# Patient Record
Sex: Female | Born: 1960 | Race: White | Hispanic: No | Smoking: Former smoker
Health system: Southern US, Community
[De-identification: ages and names within clinical notes are randomized; demographics above are authoritative.]

## PROBLEM LIST (undated history)

## (undated) DIAGNOSIS — F5104 Psychophysiologic insomnia: Secondary | ICD-10-CM

## (undated) DIAGNOSIS — E041 Nontoxic single thyroid nodule: Secondary | ICD-10-CM

## (undated) DIAGNOSIS — Z9889 Other specified postprocedural states: Secondary | ICD-10-CM

## (undated) DIAGNOSIS — K219 Gastro-esophageal reflux disease without esophagitis: Secondary | ICD-10-CM

## (undated) DIAGNOSIS — R413 Other amnesia: Principal | ICD-10-CM

## (undated) DIAGNOSIS — J45909 Unspecified asthma, uncomplicated: Secondary | ICD-10-CM

## (undated) DIAGNOSIS — K635 Polyp of colon: Secondary | ICD-10-CM

## (undated) DIAGNOSIS — M778 Other enthesopathies, not elsewhere classified: Secondary | ICD-10-CM

## (undated) DIAGNOSIS — R7309 Other abnormal glucose: Secondary | ICD-10-CM

## (undated) DIAGNOSIS — E039 Hypothyroidism, unspecified: Secondary | ICD-10-CM

## (undated) DIAGNOSIS — K449 Diaphragmatic hernia without obstruction or gangrene: Secondary | ICD-10-CM

## (undated) HISTORY — DX: Unspecified asthma, uncomplicated: J45.909

## (undated) HISTORY — DX: Nontoxic single thyroid nodule: E04.1

## (undated) HISTORY — DX: Polyp of colon: K63.5

## (undated) HISTORY — DX: Diaphragmatic hernia without obstruction or gangrene: K44.9

## (undated) HISTORY — DX: Other abnormal glucose: R73.09

## (undated) HISTORY — PX: BREAST MASS EXCISION: SHX1267

## (undated) HISTORY — PX: THYROIDECTOMY, PARTIAL: SHX18

## (undated) HISTORY — DX: Other specified postprocedural states: Z98.890

## (undated) HISTORY — DX: Gastro-esophageal reflux disease without esophagitis: K21.9

## (undated) HISTORY — DX: Other amnesia: R41.3

## (undated) HISTORY — DX: Other enthesopathies, not elsewhere classified: M77.8

## (undated) HISTORY — DX: Psychophysiologic insomnia: F51.04

## (undated) HISTORY — DX: Hypothyroidism, unspecified: E03.9

---

## 1998-10-01 HISTORY — PX: BREAST CYST EXCISION: SHX579

## 2006-10-04 ENCOUNTER — Other Ambulatory Visit: Admission: RE | Admit: 2006-10-04 | Discharge: 2006-10-04 | Payer: Self-pay | Admitting: Obstetrics & Gynecology

## 2006-10-14 ENCOUNTER — Encounter: Admission: RE | Admit: 2006-10-14 | Discharge: 2006-10-14 | Payer: Self-pay | Admitting: Obstetrics & Gynecology

## 2007-11-24 ENCOUNTER — Other Ambulatory Visit: Admission: RE | Admit: 2007-11-24 | Discharge: 2007-11-24 | Payer: Self-pay | Admitting: Obstetrics & Gynecology

## 2007-12-15 ENCOUNTER — Encounter: Admission: RE | Admit: 2007-12-15 | Discharge: 2007-12-15 | Payer: Self-pay | Admitting: Obstetrics & Gynecology

## 2007-12-26 ENCOUNTER — Encounter: Admission: RE | Admit: 2007-12-26 | Discharge: 2007-12-26 | Payer: Self-pay | Admitting: Obstetrics & Gynecology

## 2008-11-30 ENCOUNTER — Other Ambulatory Visit: Admission: RE | Admit: 2008-11-30 | Discharge: 2008-11-30 | Payer: Self-pay | Admitting: Obstetrics & Gynecology

## 2008-12-28 ENCOUNTER — Encounter: Admission: RE | Admit: 2008-12-28 | Discharge: 2008-12-28 | Payer: Self-pay | Admitting: Obstetrics & Gynecology

## 2010-01-20 ENCOUNTER — Encounter: Admission: RE | Admit: 2010-01-20 | Discharge: 2010-01-20 | Payer: Self-pay | Admitting: Family Medicine

## 2010-10-22 ENCOUNTER — Encounter: Payer: Self-pay | Admitting: Obstetrics & Gynecology

## 2011-01-05 ENCOUNTER — Other Ambulatory Visit: Payer: Self-pay | Admitting: Obstetrics & Gynecology

## 2011-01-05 DIAGNOSIS — Z1231 Encounter for screening mammogram for malignant neoplasm of breast: Secondary | ICD-10-CM

## 2011-01-24 ENCOUNTER — Ambulatory Visit: Payer: Self-pay

## 2011-01-26 ENCOUNTER — Ambulatory Visit: Payer: Self-pay

## 2011-02-08 ENCOUNTER — Ambulatory Visit
Admission: RE | Admit: 2011-02-08 | Discharge: 2011-02-08 | Disposition: A | Discharge status: Home / Self Care | Payer: 59 | Source: Ambulatory Visit | Attending: Obstetrics & Gynecology | Admitting: Obstetrics & Gynecology

## 2011-02-08 DIAGNOSIS — Z1231 Encounter for screening mammogram for malignant neoplasm of breast: Secondary | ICD-10-CM

## 2011-03-02 HISTORY — PX: HEMORRHOID BANDING: SHX5850

## 2011-12-24 ENCOUNTER — Other Ambulatory Visit: Payer: Self-pay | Admitting: Obstetrics & Gynecology

## 2011-12-24 DIAGNOSIS — Z1231 Encounter for screening mammogram for malignant neoplasm of breast: Secondary | ICD-10-CM

## 2011-12-26 ENCOUNTER — Other Ambulatory Visit: Payer: Self-pay | Admitting: Obstetrics & Gynecology

## 2011-12-26 DIAGNOSIS — Z780 Asymptomatic menopausal state: Secondary | ICD-10-CM

## 2011-12-26 DIAGNOSIS — M899 Disorder of bone, unspecified: Secondary | ICD-10-CM

## 2012-01-11 ENCOUNTER — Other Ambulatory Visit: Payer: 59

## 2012-02-22 ENCOUNTER — Ambulatory Visit: Payer: 59

## 2012-02-22 ENCOUNTER — Inpatient Hospital Stay: Admission: RE | Admit: 2012-02-22 | Payer: 59 | Source: Ambulatory Visit

## 2012-03-03 ENCOUNTER — Ambulatory Visit: Payer: 59

## 2012-03-03 ENCOUNTER — Other Ambulatory Visit: Payer: 59

## 2012-04-01 ENCOUNTER — Ambulatory Visit
Admission: RE | Admit: 2012-04-01 | Discharge: 2012-04-01 | Disposition: A | Discharge status: Home / Self Care | Payer: 59 | Source: Ambulatory Visit | Attending: Obstetrics & Gynecology | Admitting: Obstetrics & Gynecology

## 2012-04-01 DIAGNOSIS — Z780 Asymptomatic menopausal state: Secondary | ICD-10-CM

## 2012-04-01 DIAGNOSIS — M949 Disorder of cartilage, unspecified: Secondary | ICD-10-CM

## 2012-04-01 DIAGNOSIS — Z1231 Encounter for screening mammogram for malignant neoplasm of breast: Secondary | ICD-10-CM

## 2013-01-01 ENCOUNTER — Ambulatory Visit: Payer: Self-pay | Admitting: Obstetrics & Gynecology

## 2013-02-24 ENCOUNTER — Ambulatory Visit (INDEPENDENT_AMBULATORY_CARE_PROVIDER_SITE_OTHER): Payer: 59 | Admitting: Gynecology

## 2013-02-24 ENCOUNTER — Encounter: Payer: Self-pay | Admitting: Gynecology

## 2013-02-24 VITALS — BP 94/62 | HR 84 | Ht 66.0 in | Wt 167.0 lb

## 2013-02-24 DIAGNOSIS — E039 Hypothyroidism, unspecified: Secondary | ICD-10-CM | POA: Insufficient documentation

## 2013-02-24 DIAGNOSIS — Z01419 Encounter for gynecological examination (general) (routine) without abnormal findings: Secondary | ICD-10-CM

## 2013-02-24 DIAGNOSIS — IMO0002 Reserved for concepts with insufficient information to code with codable children: Secondary | ICD-10-CM | POA: Insufficient documentation

## 2013-02-24 DIAGNOSIS — R87619 Unspecified abnormal cytological findings in specimens from cervix uteri: Secondary | ICD-10-CM

## 2013-02-24 DIAGNOSIS — F17200 Nicotine dependence, unspecified, uncomplicated: Secondary | ICD-10-CM | POA: Insufficient documentation

## 2013-02-24 NOTE — Patient Instructions (Signed)

## 2013-02-24 NOTE — Progress Notes (Signed)
Patient ID: Latasha Bell, female   DOB: 08-28-1961, 52 y.o.   MRN: [id number] 52 y.o.     Caucasian   female   G2P1   here for annual exam.  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  Last menses several years ago, no PMB.  Pt did not follow up for 10/13 PAP.  Not currently sexually active.  Restarted smoking after stopping for 10y.  No LMP recorded. Patient is postmenopausal.          Sexually active: no  The current method of family planning is none.    Exercising: walking daily Last mammogram:  04/01/12, normal Last pap smear: 12/10/11 History of abnormal pap: yes  Smoking: trying to quit Alcohol: 3-4 drinks/week Last colonoscopy: 2010, rpt in 5 years Last Bone Density:  04/01/12 Last tetanus shot:2005 Last cholesterol check: March 2014   Hgb:  PCP      Urine:  PCP   Family History  Problem Relation Age of Onset  . Breast cancer Mother 30  . Colon cancer Father 37  . Cancer Brother     throat    There are no active problems to display for this patient.   Past Medical History  Diagnosis Date  . Asthma   . Hypothyroid   . Hiatal hernia   . Thyroid nodule     solid    Past Surgical History  Procedure Laterality Date  . Breast mass excision      2 breast bx's--fibrocystic changes  . Thyroidectomy, partial    . Hemorrhoid banding  03/2011    Dr. Kinnie Scales    Allergies: Review of patient's allergies indicates no known allergies.  Current Outpatient Prescriptions  Medication Sig Dispense Refill  . albuterol (PROVENTIL HFA;VENTOLIN HFA) 108 (90 BASE) MCG/ACT inhaler Inhale 1 puff into the lungs every 6 (six) hours as needed for wheezing.      Marland Kitchen Dexlansoprazole (DEXILANT) 30 MG capsule Take 30 mg by mouth daily.      . Fluticasone-Salmeterol (ADVAIR) 250-50 MCG/DOSE AEPB Inhale 1 puff into the lungs every 12 (twelve) hours.      Marland Kitchen levothyroxine (SYNTHROID, LEVOTHROID) 25 MCG tablet Take 25 mcg by mouth daily.      . Multiple Vitamin (MULTIVITAMIN) capsule Take 1  capsule by mouth daily.       No current facility-administered medications for this visit.    ROS: Pertinent items are noted in HPI.  Social Hx:    Exam:    BP 94/62  Pulse 84  Ht 5\' 6"  (1.676 m)  Wt 167 lb (75.751 kg)  BMI 26.97 kg/m2   Wt Readings from Last 3 Encounters:  02/24/13 167 lb (75.751 kg)     Ht Readings from Last 3 Encounters:  02/24/13 5\' 6"  (1.676 m)    General appearance: alert, cooperative and appears stated age Head: Normocephalic, without obvious abnormality, atraumatic Neck: no adenopathy, supple, symmetrical, trachea midline and thyroid not enlarged, symmetric, no tenderness/mass/nodules Lungs: clear to auscultation bilaterally Breasts: Inspection negative, No nipple retraction or dimpling, No nipple discharge or bleeding, No axillary or supraclavicular adenopathy, Normal to palpation without dominant masses fibrocystic Heart: regular rate and rhythm Abdomen: soft, non-tender; bowel sounds normal; no masses,  no organomegaly Extremities: extremities normal, atraumatic, no cyanosis or edema Skin: Skin color, texture, turgor normal. No rashes or lesions Lymph nodes: Cervical, supraclavicular, and axillary nodes normal. No abnormal inguinal nodes palpated Neurologic: Grossly normal   Pelvic: External genitalia:  no lesions  Urethra:  normal appearing urethra with no masses, tenderness or lesions              Bartholins and Skenes: normal                 Vagina: normal appearing vagina with normal color and discharge, no lesions              Cervix: normal appearance              Pap taken: yes        Bimanual Exam:  Uterus:  uterus is normal size, shape, consistency and nontender                                      Adnexa: normal adnexa in size, nontender and no masses                                      Rectovaginal: Confirms                                      Anus:  normal sphincter tone, no lesions  A: normal menopausal exam H/O  AGUS     P: mammogram qy, BSE pap smear not done 10/13, repeat today Labs at PCP Pt lives in [place] now, recommend getting MD there return annually or prn     An After Visit Summary was printed and given to the patient.

## 2013-02-26 LAB — IPS PAP TEST WITH HPV

## 2013-02-27 ENCOUNTER — Telehealth: Payer: Self-pay | Admitting: *Deleted

## 2013-02-27 ENCOUNTER — Other Ambulatory Visit: Payer: Self-pay | Admitting: Gynecology

## 2013-02-27 DIAGNOSIS — R87610 Atypical squamous cells of undetermined significance on cytologic smear of cervix (ASC-US): Secondary | ICD-10-CM

## 2013-02-27 DIAGNOSIS — R87619 Unspecified abnormal cytological findings in specimens from cervix uteri: Secondary | ICD-10-CM

## 2013-02-27 NOTE — Telephone Encounter (Signed)
Ideally yes, can we offer her a Monday evening or Friday morning so we don't interfer with her work?

## 2013-02-27 NOTE — Telephone Encounter (Signed)
Patient notified of pap result and need for colpo. Brief reminder of this procedure.  She requests to schedule week of 03-30-13 but Dr Farrel Gobble will be out of office.  Colpo scheduled for 03-27-13.  Do I need to "encourage" her to move this up?

## 2013-02-27 NOTE — Telephone Encounter (Signed)
Message copied by Alisa Graff on Fri Feb 27, 2013  9:36 AM ------      Message from: Douglass Rivers      Created: Fri Feb 27, 2013  7:00 AM       Pt is ASCUS-H, AGUS with HRHPV, last yr AGUS +HR, LGSIL on colpo, will need repeat colpo ------

## 2013-03-02 ENCOUNTER — Other Ambulatory Visit: Payer: Self-pay | Admitting: Gynecology

## 2013-03-02 DIAGNOSIS — IMO0002 Reserved for concepts with insufficient information to code with codable children: Secondary | ICD-10-CM

## 2013-03-05 ENCOUNTER — Other Ambulatory Visit: Payer: Self-pay

## 2013-03-05 DIAGNOSIS — Z1231 Encounter for screening mammogram for malignant neoplasm of breast: Secondary | ICD-10-CM

## 2013-03-23 ENCOUNTER — Ambulatory Visit (INDEPENDENT_AMBULATORY_CARE_PROVIDER_SITE_OTHER): Payer: 59 | Admitting: Gynecology

## 2013-03-23 ENCOUNTER — Encounter: Payer: Self-pay | Admitting: Gynecology

## 2013-03-23 VITALS — BP 100/60 | HR 76 | Ht 66.0 in | Wt 170.0 lb

## 2013-03-23 DIAGNOSIS — R87619 Unspecified abnormal cytological findings in specimens from cervix uteri: Secondary | ICD-10-CM

## 2013-03-23 DIAGNOSIS — R87810 Cervical high risk human papillomavirus (HPV) DNA test positive: Secondary | ICD-10-CM

## 2013-03-23 NOTE — Procedures (Signed)
52 y.o. single Caucasian  female  OB History   Grav Para Term Preterm Abortions TAB SAB Ect Mult Living   2 1        1      here for colposcopy. Pap done 02/26/2013 showed glandular cell abnormality (AGUS) +hrhpv  No LMP recorded. Patient is postmenopausal.  Contraception:  Post-menopausal   There were no vitals taken for this visit.  Procedure explained and patient's questions were invited and answered.  Consent form signed.    Role of HPV in genesis of SIL discussed with patient, and questions answered.   Speculum inserted atraumatically and cervix visualized.  3% acetic acid applied.  Cervix examined using 3.75 and 7.5   X magnification and green filter.    Gross appearance:normal  Squamocolumnar junction seen in entirety: yes   acetowhite lesion(s) noted circumference and abnormal vessels noted at 1, 4 o'clock  cervix swabbed with Lugol's solution, endocervical speculum placed, endocervical lesion noted at 4 o'clock, endocervical curettage performed, endometrial biopsy performed, cervical biopsies taken at 1,4,7 o'clock, specimen labelled and sent to pathology and hemostasis achieved with Monsel's solution  Extent of lesion entirely seen: yes  Patient tolerated procedure well.   Plan:  Will base further treatment on Pathology findings.  Post biopsy instructions and AVS given to patient.

## 2013-03-23 NOTE — Progress Notes (Signed)
Subjective:     Patient ID: Latasha Bell, female   DOB: 05-22-1961, 52 y.o.   MRN: [id number]  HPI  Here for colposcopy and EMB for AGUS, +HRHPV H/o LGSIL   Review of Systems     Objective:   Physical Exam  Genitourinary:    colpo images, see procedure note  EMB: 2 passes of pipelle sounded 6.5cm scant tissue obtained, to path Tolerated well     Assessment:     AGUS     Plan:     EMB, Colpo

## 2013-03-26 LAB — IPS CERVICAL/ECC/EMB/VULVAR/VAGINAL BIOPSY

## 2013-03-27 ENCOUNTER — Ambulatory Visit: Payer: Self-pay | Admitting: Gynecology

## 2013-03-30 ENCOUNTER — Telehealth: Payer: Self-pay | Admitting: *Deleted

## 2013-03-30 DIAGNOSIS — IMO0002 Reserved for concepts with insufficient information to code with codable children: Secondary | ICD-10-CM

## 2013-03-30 NOTE — Telephone Encounter (Signed)
Message copied by Alisa Graff on Mon Mar 30, 2013  9:31 AM ------      Message from: Douglass Rivers      Created: Fri Mar 27, 2013  7:59 AM       Pt has HGSIL on 2 biopsies, AGUS +HR , neg EMB, will need LEEP, please call pt after weekend ------

## 2013-03-30 NOTE — Telephone Encounter (Signed)
Message copied by Alisa Graff on Mon Mar 30, 2013  9:40 AM ------      Message from: Douglass Rivers      Created: Fri Mar 27, 2013  7:59 AM       Pt has HGSIL on 2 biopsies, AGUS +HR , neg EMB, will need LEEP, please call pt after weekend ------

## 2013-03-30 NOTE — Telephone Encounter (Signed)
Patient notified of results and discussed recommendation for LEEP.  Patient PMP, she is given several appt options on Mon and Fri (per her request) and she will call me back after checking her schedule.  She is instructed to take Motrin 800 mg 1 hr prior with food.

## 2013-03-31 DIAGNOSIS — Z9889 Other specified postprocedural states: Secondary | ICD-10-CM

## 2013-03-31 HISTORY — DX: Other specified postprocedural states: Z98.890

## 2013-04-06 ENCOUNTER — Encounter: Payer: Self-pay | Admitting: Gynecology

## 2013-04-09 ENCOUNTER — Ambulatory Visit (INDEPENDENT_AMBULATORY_CARE_PROVIDER_SITE_OTHER): Payer: 59 | Admitting: Gynecology

## 2013-04-09 VITALS — BP 100/60 | HR 80 | Resp 12 | Ht 66.0 in | Wt 169.0 lb

## 2013-04-09 DIAGNOSIS — IMO0002 Reserved for concepts with insufficient information to code with codable children: Secondary | ICD-10-CM

## 2013-04-09 DIAGNOSIS — R87613 High grade squamous intraepithelial lesion on cytologic smear of cervix (HGSIL): Secondary | ICD-10-CM

## 2013-04-09 NOTE — Procedures (Signed)
52 y.o. single Caucasian female OB History   Grav Para Term Preterm Abortions TAB SAB Ect Mult Living   2 1        1      here for LEEP.    Pap History:glandular cell abnormality (AGUS) on Pap done June 2014   Patient's last menstrual period was 10/01/2008. postmenopausal  Pre-procedure vitals: Blood pressure 100/60, pulse 80, resp. rate 12, height 5\' 6"  (1.676 m), weight 169 lb (76.658 kg), last menstrual period 10/01/2008.   Procedure explained and patient's questions were invited and answered.   Consent form signed.  Pre-procedure medication:  800mg  Motrin.  Time given:  2p  Procedure Set-up: Grounding pad located left thigh.  Cautery settings:60 cut/60coagulation.  Suction applied to coated speculum.  Procedure:  Speculum placed with good visualization of the cervix.  Colposcopy performed showing:  no abnormal vasculature and acetowhite lesion(s) noted at 12,4 and 7 o'clock.  Cervix anesthetized using 2% Xylocaine with 1:100,000units Epinephrine.  10 cc's used0.25%marcaine-5cc.Entire transition zone with 20mm loop in 2passes.  Specimen(s) placed on cork and labeled for pathology.  Hemostasis obtained with ball cautery and Monsel's solution.  EBL:  Minimal  Complications:none  Patient tolerated procedure well and left the office in satisfactory condition.  Plan:  After visit summary given.  Repeat pap planned in 12 months.

## 2013-04-14 LAB — IPS CERVICAL/ECC/EMB/VULVAR/VAGINAL BIOPSY

## 2013-04-29 ENCOUNTER — Telehealth: Payer: Self-pay | Admitting: Gynecology

## 2013-04-29 NOTE — Telephone Encounter (Signed)
Patient had LEEP procedure done 04/09/2013 with Dr. Farrel Gobble Calling of concern of spotting or noticing a trace of bleeding when wipe with urination.  Patient is out of town and will not be back till August 22,2014. Patient states she did have the spotting after the procedure as she was told to expect. Noticed the spotting Saturday when wiped and then it went away. Noted again on yesterday and went away. Today has noticed again just a trace of bleeding with wiping.  Per Dr. Farrel Gobble patient will continue to monitor and is to call back if any heavy bleeding noted and Dr. Farrel Gobble will put her in contact with someone to see and be checked in [place]. Patient is currently in [place]. Patient is to follow up with Dr. Farrel Gobble on 05/22/2013 @2 :30pm if bleeding is continuing. Appt. Made.

## 2013-04-29 NOTE — Telephone Encounter (Signed)
Patient had procedure done on 04-09-2013. Was told she would have .Some light spotting at first.  20 days later she is still experiencing some spotting wants to know if this is normal?  Should she be concerned?

## 2013-05-22 ENCOUNTER — Ambulatory Visit: Payer: 59 | Admitting: Gynecology

## 2013-05-28 ENCOUNTER — Ambulatory Visit
Admission: RE | Admit: 2013-05-28 | Discharge: 2013-05-28 | Disposition: A | Discharge status: Home / Self Care | Payer: 59 | Source: Ambulatory Visit

## 2013-05-28 DIAGNOSIS — Z1231 Encounter for screening mammogram for malignant neoplasm of breast: Secondary | ICD-10-CM

## 2013-08-06 ENCOUNTER — Other Ambulatory Visit: Payer: Self-pay

## 2013-08-17 ENCOUNTER — Encounter (HOSPITAL_COMMUNITY): Payer: Self-pay | Admitting: Emergency Medicine

## 2013-08-17 ENCOUNTER — Emergency Department (HOSPITAL_COMMUNITY): Payer: 59

## 2013-08-17 ENCOUNTER — Emergency Department (HOSPITAL_COMMUNITY)
Admission: EM | Admit: 2013-08-17 | Discharge: 2013-08-17 | Disposition: A | Discharge status: Home / Self Care | Payer: 59 | Attending: Emergency Medicine | Admitting: Emergency Medicine

## 2013-08-17 DIAGNOSIS — K219 Gastro-esophageal reflux disease without esophagitis: Secondary | ICD-10-CM | POA: Insufficient documentation

## 2013-08-17 DIAGNOSIS — M546 Pain in thoracic spine: Secondary | ICD-10-CM | POA: Insufficient documentation

## 2013-08-17 DIAGNOSIS — Z79899 Other long term (current) drug therapy: Secondary | ICD-10-CM | POA: Insufficient documentation

## 2013-08-17 DIAGNOSIS — J45909 Unspecified asthma, uncomplicated: Secondary | ICD-10-CM | POA: Insufficient documentation

## 2013-08-17 DIAGNOSIS — E039 Hypothyroidism, unspecified: Secondary | ICD-10-CM | POA: Insufficient documentation

## 2013-08-17 DIAGNOSIS — IMO0002 Reserved for concepts with insufficient information to code with codable children: Secondary | ICD-10-CM | POA: Insufficient documentation

## 2013-08-17 DIAGNOSIS — M549 Dorsalgia, unspecified: Secondary | ICD-10-CM

## 2013-08-17 DIAGNOSIS — F17200 Nicotine dependence, unspecified, uncomplicated: Secondary | ICD-10-CM | POA: Insufficient documentation

## 2013-08-17 LAB — COMPREHENSIVE METABOLIC PANEL
AST: 19 U/L (ref 0–37)
BUN: 8 mg/dL (ref 6–23)
CO2: 28 mEq/L (ref 19–32)
Calcium: 8.9 mg/dL (ref 8.4–10.5)
Chloride: 104 mEq/L (ref 96–112)
Creatinine, Ser: 0.88 mg/dL (ref 0.50–1.10)
GFR calc non Af Amer: 74 mL/min — ABNORMAL LOW (ref >90)
Glucose, Bld: 107 mg/dL — ABNORMAL HIGH (ref 70–99)
Total Bilirubin: 0.2 mg/dL — ABNORMAL LOW (ref 0.3–1.2)

## 2013-08-17 LAB — CBC
HCT: 39.1 % (ref 36.0–46.0)
Hemoglobin: 13.2 g/dL (ref 12.0–15.0)
MCV: 91.1 fL (ref 78.0–100.0)
RBC: 4.29 MIL/uL (ref 3.87–5.11)
WBC: 5.5 10*3/uL (ref 4.0–10.5)

## 2013-08-17 LAB — POCT I-STAT TROPONIN I

## 2013-08-17 MED ORDER — HYDROCODONE-ACETAMINOPHEN 5-325 MG PO TABS: 2.0000 | ORAL_TABLET | ORAL | Status: DC | PRN

## 2013-08-17 MED ORDER — KETOROLAC TROMETHAMINE 30 MG/ML IJ SOLN: 30.0000 mg | Freq: Once | INTRAMUSCULAR | Status: DC

## 2013-08-17 MED ORDER — MORPHINE SULFATE 2 MG/ML IJ SOLN
2.0000 mg | Freq: Once | INTRAMUSCULAR | Status: AC
  Administered 2013-08-17: 2 mg via INTRAVENOUS
  Filled 2013-08-17: qty 1

## 2013-08-17 MED ORDER — HYDROCODONE-ACETAMINOPHEN 5-325 MG PO TABS
1.0000 | ORAL_TABLET | Freq: Once | ORAL | Status: AC
  Administered 2013-08-17: 1 via ORAL
  Filled 2013-08-17: qty 1

## 2013-08-17 NOTE — ED Notes (Signed)
Patient states back pain starting this am at approx 0400.  Patient states throbbing, pressure type pain.  Patient states no chest pain/sob. No urinary symptoms.

## 2013-08-17 NOTE — ED Notes (Signed)
Patient transported to X-ray 

## 2013-08-17 NOTE — ED Provider Notes (Signed)
CSN: [id number]     Arrival date & time 08/17/13  6578 History   First MD Initiated Contact with Patient 08/17/13 [phone]     Chief Complaint  Patient presents with  . Back Pain   (Consider location/radiation/quality/duration/timing/severity/associated sxs/prior Treatment) HPI Comments: The patient is a 52 year-old female with a past medical history of GERD, , presenting the Emergency Department with a chief complaint of upper back discomfort.  0345 awoke from sleeping Pressure Constant  No history of trauma  No recent overuse Nothing aggravates Nothing alleviates  Discomfort was not relived by nitro.  No family history of CAD   Patient is a 52 y.o. female presenting with back pain. The history is provided by the patient. No language interpreter was used.  Back Pain Location:  Thoracic spine Quality:  Aching Radiates to:  Does not radiate Pain severity:  Moderate Pain is:  Unable to specify Onset quality:  Sudden (0345 today) Duration:  2 hours Timing:  Constant Progression:  Unchanged Chronicity:  New Context: not emotional stress, not falling, not jumping from heights, not lifting heavy objects, not MCA, not MVA, not occupational injury, not pedestrian accident, not physical stress, not recent illness, not recent injury and not twisting   Relieved by:  Nothing Worsened by:  Nothing tried Ineffective treatments: Nitro. Associated symptoms: no abdominal pain, no abdominal swelling, no bladder incontinence, no bowel incontinence, no chest pain, no dysuria, no fever, no headaches, no leg pain, no numbness, no paresthesias, no perianal numbness, no tingling and no weakness   Risk factors: no lack of exercise, not obese, not pregnant and no recent surgery     Past Medical History  Diagnosis Date  . Asthma   . Hypothyroid   . Hiatal hernia   . Thyroid nodule     solid   Past Surgical History  Procedure Laterality Date  . Breast mass excision      2 breast  bx's--fibrocystic changes  . Thyroidectomy, partial    . Hemorrhoid banding  03/2011    Dr. Kinnie Scales   Family History  Problem Relation Age of Onset  . Breast cancer Mother 28  . Colon cancer Father 67  . Cancer Brother     throat   History  Substance Use Topics  . Smoking status: Current Every Day Smoker -- 0.50 packs/day for 15 years    Types: Cigarettes  . Smokeless tobacco: Never Used  . Alcohol Use: 1.5 - 2 oz/week    3-4 drink(s) per week   OB History   Grav Para Term Preterm Abortions TAB SAB Ect Mult Living   2 1        1      Review of Systems  Constitutional: Negative for fever.  Cardiovascular: Negative for chest pain.  Gastrointestinal: Negative for abdominal pain and bowel incontinence.  Genitourinary: Negative for bladder incontinence and dysuria.  Musculoskeletal: Positive for back pain.  Neurological: Negative for tingling, weakness, numbness, headaches and paresthesias.  All other systems reviewed and are negative.    Allergies  Review of patient's allergies indicates no known allergies.  Home Medications   Current Outpatient Rx  Name  Route  Sig  Dispense  Refill  . albuterol (PROVENTIL HFA;VENTOLIN HFA) 108 (90 BASE) MCG/ACT inhaler   Inhalation   Inhale 1 puff into the lungs every 6 (six) hours as needed for wheezing.         Marland Kitchen AMITRIPTYLINE HCL PO   Oral   Take 1 capsule by mouth  at bedtime.         . BuPROPion HCl (WELLBUTRIN PO)   Oral   Take 2 tablets by mouth every morning.         Marland Kitchen Dexlansoprazole (DEXILANT) 30 MG capsule   Oral   Take 30 mg by mouth daily.         Marland Kitchen levothyroxine (SYNTHROID, LEVOTHROID) 25 MCG tablet   Oral   Take 25 mcg by mouth daily.         . mometasone-formoterol (DULERA) 100-5 MCG/ACT AERO   Inhalation   Inhale 2 puffs into the lungs 2 (two) times daily.         . Multiple Vitamin (MULTIVITAMIN) capsule   Oral   Take 1 capsule by mouth daily.         Marland Kitchen HYDROcodone-acetaminophen  (NORCO/VICODIN) 5-325 MG per tablet   Oral   Take 2 tablets by mouth every 4 (four) hours as needed.   10 tablet   0    BP 120/72  Temp(Src) 98.1 F (36.7 C) (Oral)  Resp 18  SpO2 100%  LMP 10/01/2008 Physical Exam  Nursing note and vitals reviewed. Constitutional: She is oriented to person, place, and time. Vital signs are normal. She appears well-developed and well-nourished. She is active. No distress.  HENT:  Head: Normocephalic and atraumatic.  Mouth/Throat: No oropharyngeal exudate.  Neck: Neck supple.  Cardiovascular: Normal rate and regular rhythm.   Pulmonary/Chest: Effort normal and breath sounds normal. No respiratory distress. She has no wheezes. She has no rales. She exhibits no tenderness.  Abdominal: Soft. Bowel sounds are normal. She exhibits no distension. There is no tenderness. There is no rebound.  Musculoskeletal: She exhibits no edema.       Thoracic back: Normal. She exhibits normal range of motion, no bony tenderness, no swelling, no deformity and no spasm.       Back:  Full active ROM to T-spine, no midline tenderness.  Non-tender to palpation.  Neurological: She is alert and oriented to person, place, and time.  Skin: Skin is warm and dry.  Psychiatric: She has a normal mood and affect.    ED Course  Procedures (including critical care time) Labs Review Labs Reviewed  COMPREHENSIVE METABOLIC PANEL - Abnormal; Notable for the following:    Potassium 3.4 (*)    Glucose, Bld 107 (*)    Total Bilirubin 0.2 (*)    GFR calc non Af Amer 74 (*)    GFR calc Af Amer 85 (*)    All other components within normal limits  CBC  POCT I-STAT TROPONIN I  POCT I-STAT TROPONIN I   Imaging Review No results found.  EKG Interpretation    Date/Time:  Monday August 17 2013 06:57:11 EST Ventricular Rate:  67 PR Interval:  155 QRS Duration: 82 QT Interval:  391 QTC Calculation: 413 R Axis:   -10 Text Interpretation:  Sinus or ectopic atrial rhythm Low  voltage, precordial leads Probable anteroseptal infarct, old ED PHYSICIAN INTERPRETATION AVAILABLE IN [facility] Confirmed by TEST, RECORD ([id number]) on 08/19/2013 8:17:21 AM            MDM   1. Upper back pain   The patient reports  A sudden onset of upper back pain without radiation. Not reproducible by movement or palpation.  Rule out ACS and fracture or lung etiology.  Labs and imaging ordered. Pain medication ordered.  Discussed patient condition with Dr. Oletta Lamas who agrees with current lab and imaging studies.  Troponin-0.00 XR-without acute cause of pain. 0745-Discussed lab results, imaging results with the patient she reports her discomfort is the same, medication was just given to the patient.  No wheezing on exam  Troponin-0.00 Discussed labs and imagine  Discussed lab results, imaging results, and treatment plan with the patient.  She reports understanding and no other concerns at this time.   Patient is stable for discharge at this time.  Meds given in ED:  Medications  morphine 2 MG/ML injection 2 mg (2 mg Intravenous Given 08/17/13 0747)  HYDROcodone-acetaminophen (NORCO/VICODIN) 5-325 MG per tablet 1 tablet (1 tablet Oral Given 08/17/13 1054)    Discharge Medication List as of 08/17/2013 10:48 AM    START taking these medications   Details  HYDROcodone-acetaminophen (NORCO/VICODIN) 5-325 MG per tablet Take 2 tablets by mouth every 4 (four) hours as needed., Starting 08/17/2013, Until Discontinued, Print          Clabe Seal, PA-C 08/20/13 1339

## 2013-08-17 NOTE — ED Notes (Signed)
Patient received ASA 324 mg and Nitro x 1 per EMS pta.  No relief with nitro.

## 2013-08-21 NOTE — ED Provider Notes (Signed)
Medical screening examination/treatment/procedure(s) were performed by non-physician practitioner and as supervising physician I was immediately available for consultation/collaboration.  EKG Interpretation    Date/Time:  Monday August 17 2013 06:57:11 EST Ventricular Rate:  67 PR Interval:  155 QRS Duration: 82 QT Interval:  391 QTC Calculation: 413 R Axis:   -10 Text Interpretation:  Sinus or ectopic atrial rhythm Low voltage, precordial leads Probable anteroseptal infarct, old ED PHYSICIAN INTERPRETATION AVAILABLE IN [facility] Confirmed by TEST, RECORD ([id number]) on 08/19/2013 8:17:21 AM             Olivia Mackie, MD 08/21/13 1939

## 2014-03-29 ENCOUNTER — Ambulatory Visit (INDEPENDENT_AMBULATORY_CARE_PROVIDER_SITE_OTHER): Payer: 59 | Admitting: Gynecology

## 2014-03-29 ENCOUNTER — Encounter: Payer: Self-pay | Admitting: Gynecology

## 2014-03-29 VITALS — BP 110/70 | HR 60 | Resp 18 | Ht 66.5 in | Wt 176.0 lb

## 2014-03-29 DIAGNOSIS — Z9889 Other specified postprocedural states: Secondary | ICD-10-CM

## 2014-03-29 DIAGNOSIS — R6889 Other general symptoms and signs: Secondary | ICD-10-CM

## 2014-03-29 DIAGNOSIS — Z124 Encounter for screening for malignant neoplasm of cervix: Secondary | ICD-10-CM

## 2014-03-29 DIAGNOSIS — G4700 Insomnia, unspecified: Secondary | ICD-10-CM

## 2014-03-29 DIAGNOSIS — IMO0002 Reserved for concepts with insufficient information to code with codable children: Secondary | ICD-10-CM

## 2014-03-29 DIAGNOSIS — Z01419 Encounter for gynecological examination (general) (routine) without abnormal findings: Secondary | ICD-10-CM

## 2014-03-29 NOTE — Progress Notes (Signed)
53 y.o. Single Caucasian female   G2P1 here for annual exam. Pt reports menses are absent due to Menopause regular.  She occassionally report hot flashes, occassionally have night sweats, does have vaginal dryness.  She is not using lubricants.  She does not report post-menopasual bleeding. Pt quit smoking 5031m ago, has not needed inhaler as much, did gain 10# Pt reports issues with insomnia, can fall asleep and wake up a few hours later, or cannot fall asleep.  Pt can drink a few glasses of ice tea/d-16-24oz.  Patient's last menstrual period was 10/01/2008.          Sexually active: Yes.    The current method of family planning is post menopausal status.    Exercising: Yes.    walking 3-4x/wk for a hour Last pap: 6/14- AGUS +HPV, LEEP  Abnormal PAP: yes Mammogram: 05/28/13 Bi-Rads 1 BSE: kind of  Colonoscopy: 03/26/14 Normal f/u in 5 years  DEXA: 04/01/12  Alcohol: 3-4 drinks/wk Tobacco: no  Labs: PCP   Health Maintenance  Topic Date Due  . Tetanus/tdap  03/31/1979  . Colonoscopy  03/30/2010  . Influenza Vaccine  05/01/2014  . Mammogram  05/29/2015  . Pap Smear  02/25/2016    Family History  Problem Relation Age of Onset  . Breast cancer Mother 3450  . Colon cancer Father 4676  . Cancer Brother     throat    Patient Active Problem List   Diagnosis Date Noted  . AGUS favor dysplasia 02/24/2013  . Unspecified hypothyroidism 02/24/2013  . Smoker 02/24/2013    Past Medical History  Diagnosis Date  . Asthma   . Hypothyroid   . Hiatal hernia   . Thyroid nodule     solid    Past Surgical History  Procedure Laterality Date  . Breast mass excision      2 breast bx's--fibrocystic changes  . Thyroidectomy, partial    . Hemorrhoid banding  03/2011    Dr. Kinnie ScalesMedoff    Allergies: Review of patient's allergies indicates no known allergies.  Current Outpatient Prescriptions  Medication Sig Dispense Refill  . albuterol (PROVENTIL HFA;VENTOLIN HFA) 108 (90 BASE) MCG/ACT inhaler  Inhale 1 puff into the lungs every 6 (six) hours as needed for wheezing.      Marland Kitchen. AMITRIPTYLINE HCL PO Take 1 capsule by mouth at bedtime.      . BuPROPion HCl (WELLBUTRIN PO) Take 2 tablets by mouth every morning.      Marland Kitchen. Dexlansoprazole (DEXILANT) 30 MG capsule Take 30 mg by mouth daily.      Marland Kitchen. HYDROcodone-acetaminophen (NORCO/VICODIN) 5-325 MG per tablet Take 2 tablets by mouth every 4 (four) hours as needed.  10 tablet  0  . levothyroxine (SYNTHROID, LEVOTHROID) 25 MCG tablet Take 25 mcg by mouth daily.      . mometasone-formoterol (DULERA) 100-5 MCG/ACT AERO Inhale 2 puffs into the lungs 2 (two) times daily.      . Multiple Vitamin (MULTIVITAMIN) capsule Take 1 capsule by mouth daily.       No current facility-administered medications for this visit.    ROS: Pertinent items are noted in HPI.  Exam:    LMP 10/01/2008 Weight change: @WEIGHTCHANGE @ Last 3 height recordings:  Ht Readings from Last 3 Encounters:  04/09/13 5\' 6"  (1.676 m)  03/23/13 5\' 6"  (1.676 m)  02/24/13 5\' 6"  (1.676 m)   General appearance: alert, cooperative and appears stated age Head: Normocephalic, without obvious abnormality, atraumatic Neck: no adenopathy, no carotid bruit, no  JVD, supple, symmetrical, trachea midline and thyroid not enlarged, symmetric, no tenderness/mass/nodules Lungs: clear to auscultation bilaterally Breasts: normal appearance, no masses or tenderness Heart: regular rate and rhythm, S1, S2 normal, no murmur, click, rub or gallop Abdomen: soft, non-tender; bowel sounds normal; no masses,  no organomegaly Extremities: extremities normal, atraumatic, no cyanosis or edema Skin: Skin color, texture, turgor normal. No rashes or lesions Lymph nodes: Cervical, supraclavicular, and axillary nodes normal. no inguinal nodes palpated Neurologic: Grossly normal   Pelvic: External genitalia:  normal escutcheon              Urethra: normal appearing urethra with no masses, tenderness or lesions               Bartholins and Skenes: Bartholin's, Urethra, Skene's normal                 Vagina: normal appearing vagina with normal color and discharge, no lesions              Cervix: scarred              Pap taken: Yes.          Bimanual Exam:  Uterus:  uterus is normal size, shape, consistency and nontender                                      Adnexa:    normal adnexa in size, nontender and no masses                                      Rectovaginal: Confirms                                      Anus:  normal sphincter tone, no lesions      1. Encounter for routine gynecological examination Mammogram scheduled counseled on breast self exam, mammography screening, adequate intake of calcium and vitamin D, diet and exercise return annually or prn   2. AGUS favor dysplasia First pap after LEEP - PAP Smear Only (IPS)  3. Screening for cervical cancer Pap guidelines reviewed - PAP Smear Only (IPS)  4. S/P LEEP  - PAP Smear Only (IPS)  5. Insomnia otc products discussed Decrease caffiene  Discussed PAP guideline changes, importance of weight bearing exercises, calcium, vit D and balanced diet.  An After Visit Summary was printed and given to the patient.

## 2014-03-29 NOTE — Patient Instructions (Signed)
Can try otc unisom, tylenol pm, advil pm or benadryl as needed for occasional insomnia.  Try to minimize iced tea

## 2014-04-01 LAB — IPS PAP SMEAR ONLY

## 2014-05-07 ENCOUNTER — Other Ambulatory Visit: Payer: Self-pay

## 2014-05-07 DIAGNOSIS — Z1231 Encounter for screening mammogram for malignant neoplasm of breast: Secondary | ICD-10-CM

## 2014-06-01 ENCOUNTER — Ambulatory Visit
Admission: RE | Admit: 2014-06-01 | Discharge: 2014-06-01 | Disposition: A | Discharge status: Home / Self Care | Payer: 59 | Source: Ambulatory Visit

## 2014-06-01 DIAGNOSIS — Z1231 Encounter for screening mammogram for malignant neoplasm of breast: Secondary | ICD-10-CM

## 2014-08-02 ENCOUNTER — Encounter: Payer: Self-pay | Admitting: Gynecology

## 2015-04-01 ENCOUNTER — Ambulatory Visit: Payer: 59 | Admitting: Obstetrics and Gynecology

## 2015-04-01 ENCOUNTER — Ambulatory Visit: Payer: 59 | Admitting: Gynecology

## 2015-05-02 ENCOUNTER — Encounter: Payer: Self-pay | Admitting: Neurology

## 2015-05-02 ENCOUNTER — Ambulatory Visit (INDEPENDENT_AMBULATORY_CARE_PROVIDER_SITE_OTHER): Payer: 59 | Admitting: Neurology

## 2015-05-02 VITALS — BP 105/69 | HR 85 | Ht 66.0 in | Wt 172.8 lb

## 2015-05-02 DIAGNOSIS — G4700 Insomnia, unspecified: Secondary | ICD-10-CM

## 2015-05-02 DIAGNOSIS — R413 Other amnesia: Secondary | ICD-10-CM

## 2015-05-02 DIAGNOSIS — F5104 Psychophysiologic insomnia: Secondary | ICD-10-CM

## 2015-05-02 HISTORY — DX: Other amnesia: R41.3

## 2015-05-02 HISTORY — DX: Psychophysiologic insomnia: F51.04

## 2015-05-02 MED ORDER — MIRTAZAPINE 15 MG PO TABS: 15.0000 mg | ORAL_TABLET | Freq: Every day | ORAL | Status: DC

## 2015-05-02 NOTE — Patient Instructions (Signed)
   We will schedule up for blood work today, and set up MRI evaluation the brain given the history of prior head trauma. I will set she will for formal neuropsychological evaluation to fully assess the memory issue. We will follow-up in several months. We will start on Remeron at night for sleep, stop the amitriptyline.
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
## 2015-05-03 LAB — SEDIMENTATION RATE: Sed Rate: 2 mm/hr (ref 0–40)

## 2015-05-03 LAB — HIV ANTIBODY (ROUTINE TESTING W REFLEX): HIV Screen 4th Generation wRfx: NONREACTIVE

## 2015-05-03 LAB — COPPER, SERUM: Copper: 116 ug/dL (ref 72–166)

## 2015-05-03 LAB — VITAMIN B12: VITAMIN B 12: 721 pg/mL (ref 211–946)

## 2015-05-03 LAB — RPR: RPR Ser Ql: NONREACTIVE

## 2015-05-13 DIAGNOSIS — R413 Other amnesia: Secondary | ICD-10-CM | POA: Diagnosis not present

## 2015-05-13 DIAGNOSIS — G4700 Insomnia, unspecified: Secondary | ICD-10-CM | POA: Diagnosis not present

## 2015-05-16 ENCOUNTER — Ambulatory Visit (INDEPENDENT_AMBULATORY_CARE_PROVIDER_SITE_OTHER): Payer: Self-pay

## 2015-05-16 ENCOUNTER — Telehealth: Payer: Self-pay | Admitting: Neurology

## 2015-05-16 DIAGNOSIS — R413 Other amnesia: Secondary | ICD-10-CM

## 2015-05-16 DIAGNOSIS — Z0289 Encounter for other administrative examinations: Secondary | ICD-10-CM

## 2015-05-16 DIAGNOSIS — F5104 Psychophysiologic insomnia: Secondary | ICD-10-CM

## 2015-05-16 NOTE — Telephone Encounter (Signed)
I called patient. MRI the brain is normal. Formal neuropsychological evaluation is pending.   MRI brain 05/16/2015:  IMPRESSION: This is a normal MRI of the brain without contrast. Incidental note is made of mild left maxillary and ethmoid chronic sinusitis.

## 2015-05-23 ENCOUNTER — Other Ambulatory Visit: Payer: Self-pay

## 2015-05-23 DIAGNOSIS — Z1231 Encounter for screening mammogram for malignant neoplasm of breast: Secondary | ICD-10-CM

## 2015-06-10 ENCOUNTER — Encounter: Payer: Self-pay | Admitting: Obstetrics and Gynecology

## 2015-06-10 ENCOUNTER — Ambulatory Visit (INDEPENDENT_AMBULATORY_CARE_PROVIDER_SITE_OTHER): Payer: 59 | Admitting: Obstetrics and Gynecology

## 2015-06-10 VITALS — BP 88/66 | HR 72 | Resp 16 | Ht 65.75 in | Wt 171.0 lb

## 2015-06-10 DIAGNOSIS — Z01419 Encounter for gynecological examination (general) (routine) without abnormal findings: Secondary | ICD-10-CM
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
## 2015-06-10 NOTE — Patient Instructions (Signed)

## 2015-06-14 LAB — IPS PAP TEST WITH HPV

## 2015-06-24 ENCOUNTER — Ambulatory Visit
Admission: RE | Admit: 2015-06-24 | Discharge: 2015-06-24 | Disposition: A | Discharge status: Home / Self Care | Payer: 59 | Source: Ambulatory Visit

## 2015-06-24 DIAGNOSIS — Z1231 Encounter for screening mammogram for malignant neoplasm of breast: Secondary | ICD-10-CM

## 2015-07-04 ENCOUNTER — Other Ambulatory Visit: Payer: Self-pay

## 2015-07-04 MED ORDER — MIRTAZAPINE 15 MG PO TABS: 15.0000 mg | ORAL_TABLET | Freq: Every day | ORAL | Status: DC

## 2015-07-22 ENCOUNTER — Encounter: Payer: Self-pay | Admitting: Psychology

## 2015-07-22 ENCOUNTER — Ambulatory Visit: Payer: 59 | Attending: Psychology | Admitting: Psychology

## 2015-07-22 DIAGNOSIS — R413 Other amnesia: Secondary | ICD-10-CM | POA: Diagnosis not present

## 2015-07-22 NOTE — Progress Notes (Signed)
[facility]  [address]   Telephone [phone] [address] Fax [phone] [place], [place] [address]  Initial Contact Note  Name:  Latasha Bell Date of Birth; [birth year] MRN:  [id number] Date:  07/22/2015  Latasha Bell is an 54 y.o. female who was referred for neuropsychological evaluation by C. Lesia SagoKeith Willis, MD due to her complaints of memory problems.   A total of 5 hours was spent today reviewing medical records, interviewing (CPT [phone]) Latasha Bell and administering and scoring neurocognitive tests (CPT [id number] & [id number]).  Preliminary Diagnostic Impression: Memory loss [R41.3]  There were no concerns expressed or behaviors displayed by [name] that would require immediate attention.   A full report will follow once the planned testing has been completed. Her next appointment is scheduled for 07/29/15.Gladstone Pih.   Muzamil Harker F. Elmire Amrein, Ph.D Licensed Psychologist 07/22/2015

## 2015-07-29 ENCOUNTER — Ambulatory Visit (INDEPENDENT_AMBULATORY_CARE_PROVIDER_SITE_OTHER): Payer: 59 | Admitting: Psychology

## 2015-07-29 DIAGNOSIS — R413 Other amnesia: Secondary | ICD-10-CM | POA: Diagnosis not present
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
## 2015-08-11 ENCOUNTER — Other Ambulatory Visit: Payer: Self-pay | Admitting: Neurology

## 2015-08-13 ENCOUNTER — Telehealth: Payer: Self-pay | Admitting: Neurology

## 2015-08-13 NOTE — Telephone Encounter (Signed)
  The neuropsychological test results were already discussed with the patient.  Summary & Conclusions Latasha Bell is a 54 year old woman who reported an abrupt decline in her concentration and memory in 2014 that was coincident with a decline in her sleep quality. Her past medical history was notable for a mild traumatic brain injury (TBI) in 2005.  Latasha Bell performed predominantly within normal expectations on the neuropsychological test battery. She demonstrated a sole weakness on language tests that required retrieval from semantic lexicon in order to name to confrontation or fluently generate words. Otherwise, she performed within normal expectations on measures of sustained or complex attention, speed of processing, mental flexibility, learning, memory, visual-spatial organization and conceptual reasoning. With regards to her emotional functioning, she reported minimal psychological distress on standardized symptom inventories and an absence of difficulty with psychosocial adjustment.  Neuropsychological test results coupled with consideration of her clinical history did not readily suggest an explanation for her reported cognitive problems. From a neurological perspective, there was no neuroradiological evidence of brain pathology on a brain MRI scan in August 2016. While she sustained a TBI in 2005 (which by symptom report and functional outcome seemed most akin to a mild TBI), she reported that all of her post concussive symptoms gradually resolved over the course of the next year. She stated that her short-term memory greatly improved though not fully to her pre-injury baseline. In any case, one would not expect a substantial decline in cognitive functioning to suddenly manifest nine years after a TBI, unless the external demands on her increased to the point of exceeding her residual cognitive capabilities. She reported with certainty, however, that the onset of her cognitive difficulties was  not coincident with any major changes in her lifestyle and began prior to her starting a new job in early 2016. The idea that her cognitive problems could be associated with menopause was entertained though discounted as she had been experiencing menopausal symptoms (i.e., hot flashes, night sweats, insomnia) for about seven years prior to the onset of her cognitive decline. Sleep disturbance with associated daytime sleepiness also has the potential to disrupt cognitive processing though her report of sleep disturbance did not seem severe enough to cause her subjective degree of cognitive dysfunction. Moreover, she reported improved sleep and decreased racing thoughts since she started taking mirtazapine in August 2016 though without subjective change in her cognitive functioning. Finally, from a psychological perspective, she denied symptoms consistent with an emotional disorder; and she reported that her PTSD has been asymptomatic [place] not living [place].

## 2015-09-08 ENCOUNTER — Ambulatory Visit (INDEPENDENT_AMBULATORY_CARE_PROVIDER_SITE_OTHER): Payer: 59 | Admitting: Neurology

## 2015-09-08 ENCOUNTER — Encounter: Payer: Self-pay | Admitting: Neurology

## 2015-09-08 VITALS — BP 112/65 | HR 74 | Ht 66.0 in | Wt 181.5 lb

## 2015-09-08 DIAGNOSIS — R531 Weakness: Secondary | ICD-10-CM | POA: Diagnosis not present

## 2015-09-08 DIAGNOSIS — G4700 Insomnia, unspecified: Secondary | ICD-10-CM | POA: Diagnosis not present

## 2015-09-08 DIAGNOSIS — R413 Other amnesia: Secondary | ICD-10-CM | POA: Diagnosis not present

## 2015-09-08 DIAGNOSIS — M2560 Stiffness of unspecified joint, not elsewhere classified: Secondary | ICD-10-CM | POA: Diagnosis not present

## 2015-09-08 DIAGNOSIS — F5104 Psychophysiologic insomnia: Secondary | ICD-10-CM

## 2015-09-08 MED ORDER — TRAZODONE HCL 50 MG PO TABS: 50.0000 mg | ORAL_TABLET | Freq: Every day | ORAL | Status: DC

## 2015-09-08 NOTE — Progress Notes (Signed)
Reason for visit: Memory disorder  Latasha Bell is an 54 y.o. female  History of present illness:  Latasha Bell is a 54 year old right-handed white female with a history of some issues with memory and cognitive functioning. The patient has undergone MRI of the brain that was normal. Neuropsychological evaluation has been done, this shows normal memory and cognitive functioning with exception of isolated verbal capacity and word finding problems. 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 She has not definitely noted any significant progression since last seen in August 2016. The patient has had some insomnia issues, and she was taken off of amitriptyline, placed on Remeron. This seems to help, but she is gaining weight on the medication. She also reports other issues that include some weakness of the hands, and difficulty opening bottles. The patient has noted a mild balance issue. These issues have been present for one year. The patient indicates that she may fall on occasion. She reports a sensation of stiffness of the extremities when she first gets up in the morning, this will improve when she moves about. She returns to this office for an evaluation.  Past Medical History  Diagnosis Date  . Asthma   . Hypothyroid   . Hiatal hernia   . Thyroid nodule     solid  . S/P LEEP (loop electrosurgical excision procedure) 7/14    HGSIL  . Colon polyp   . Acid reflux   . Memory difficulties 05/02/2015  . Chronic insomnia 05/02/2015    Past Surgical History  Procedure Laterality Date  . Breast mass excision      2 breast bx's--fibrocystic changes  . Thyroidectomy, partial    . Hemorrhoid banding  03/2011    Dr. Kinnie Bell    Family History  Problem Relation Age of Onset  . Breast cancer Mother 550  . Colon cancer Father [id number]  . Cancer Brother     throat    Social history:  reports that she has  quit smoking. Her smoking use included Cigarettes. She has a 7.5 pack-year smoking history. She has never used smokeless tobacco. She reports that she does not drink alcohol or use illicit drugs.   No Known Allergies  Medications:  Prior to Admission medications   Medication Sig Start Date End Date Taking? Authorizing Provider  buPROPion (WELLBUTRIN SR) 150 MG 12 hr tablet Take 150 mg by mouth 2 (two) times daily.   Yes Historical Provider, MD  Fluticasone-Salmeterol (ADVAIR) 100-50 MCG/DOSE AEPB Inhale 1 puff into the lungs 2 (two) times daily.   Yes Historical Provider, MD  levothyroxine (SYNTHROID, LEVOTHROID) 100 MCG tablet Take 100 mcg by mouth daily before breakfast. 1 tablet Monday-Saturday and 1/2 tablet Sunday   Yes Historical Provider, MD  mirtazapine (REMERON) 15 MG tablet Take 1 tablet by mouth at  bedtime 08/11/15  Yes Latasha Spanielharles K Allyse Fregeau, MD  Multiple Vitamin (MULTIVITAMIN) capsule Take 1 capsule by mouth daily.   Yes Historical Provider, MD  omeprazole (PRILOSEC) 40 MG capsule Take 40 mg by mouth daily.   Yes Historical Provider, MD    ROS:  Out of a complete 14 system review of symptoms, the patient complains only of the following symptoms, and all other reviewed systems are negative.  Weight increase, excessive sweating Cold intolerance, excessive eating Constipation Insomnia Joint pain, walking difficulty, stiffness Memory loss, word finding problems, weakness, gait disturbance  Blood pressure  112/65, pulse 74, height  (1.676 m), weight 181 lb 8 oz (82.328 kg), last menstrual period 10/01/2008.  Physical Exam  General: The patient is alert and cooperative at the time of the examination.  Neuromuscular: The patient lacks about 15 of full lateral rotation of the cervical spine bilaterally. The patient has relatively full range of movement of the low back.  Skin: No significant peripheral edema is noted.   Neurologic Exam  Mental status: The patient is alert  and oriented x 3 at the time of the examination. The patient has apparent normal recent and remote memory, with an apparently normal attention span and concentration ability. Mini-Mental Status Examination done today shows a total score of 30/30. The patient is able to name 11 animals in 30 seconds.   Cranial nerves: Facial symmetry is present. Speech is normal, no aphasia or dysarthria is noted. Extraocular movements are full. Visual fields are full.  Motor: The patient has good strength in all 4 extremities.  Sensory examination: Soft touch sensation is symmetric on the face, arms, and legs.  Coordination: The patient has good finger-nose-finger and heel-to-shin bilaterally.  Gait and station: The patient has a normal gait. Tandem gait is normal. Romberg is negative. No drift is seen.  Reflexes: Deep tendon reflexes are symmetric.   Assessment/Plan:  1. Cognitive changes, word finding problems  2. Reported hand weakness, muscle stiffness  3. Reported mild gait disturbance  The patient will be sent for further blood work today. The cognitive issues will need to followed over time. The patient appears to have isolated verbal capacity abnormalities on neuropsychological evaluation. I suppose that an early primary progressive aphasia needs to be considered. The patient will not be placed on medical therapy for memory issues at this time. She is having weight gain on Remeron, she will be off of this medication and switched to low-dose trazodone. She will follow-up in 6 months, sooner if needed.  Latasha Palau MD 09/08/2015 7:51 PM  [facility] [address] [place], [place] [address]  Phone [phone] Fax [phone]

## 2015-09-08 NOTE — Patient Instructions (Signed)
Insomnia Insomnia is a sleep disorder that makes it difficult to fall asleep or to stay asleep. Insomnia can cause tiredness (fatigue), low energy, difficulty concentrating, mood swings, and poor performance at work or school.  There are three different ways to classify insomnia:  Difficulty falling asleep.  Difficulty staying asleep.  Waking up too early in the morning. Any type of insomnia can be long-term (chronic) or short-term (acute). Both are common. Short-term insomnia usually lasts for three months or less. Chronic insomnia occurs at least three times a week for longer than three months. CAUSES  Insomnia may be caused by another condition, situation, or substance, such as:  Anxiety.  Certain medicines.  Gastroesophageal reflux disease (GERD) or other gastrointestinal conditions.  Asthma or other breathing conditions.  Restless legs syndrome, sleep apnea, or other sleep disorders.  Chronic pain.  Menopause. This may include hot flashes.  Stroke.  Abuse of alcohol, tobacco, or illegal drugs.  Depression.  Caffeine.   Neurological disorders, such as Alzheimer disease.  An overactive thyroid (hyperthyroidism). The cause of insomnia may not be known. RISK FACTORS Risk factors for insomnia include:  Gender. Women are more commonly affected than men.  Age. Insomnia is more common as you get older.  Stress. This may involve your professional or personal life.  Income. Insomnia is more common in people with lower income.  Lack of exercise.   Irregular work schedule or night shifts.  Traveling between different time zones. SIGNS AND SYMPTOMS If you have insomnia, trouble falling asleep or trouble staying asleep is the main symptom. This may lead to other symptoms, such as:  Feeling fatigued.  Feeling nervous about going to sleep.  Not feeling rested in the morning.  Having trouble concentrating.  Feeling irritable, anxious, or depressed. TREATMENT   Treatment for insomnia depends on the cause. If your insomnia is caused by an underlying condition, treatment will focus on addressing the condition. Treatment may also include:   Medicines to help you sleep.  Counseling or therapy.  Lifestyle adjustments. HOME CARE INSTRUCTIONS   Take medicines only as directed by your health care provider.  Keep regular sleeping and waking hours. Avoid naps.  Keep a sleep diary to help you and your health care provider figure out what could be causing your insomnia. Include:   When you sleep.  When you wake up during the night.  How well you sleep.   How rested you feel the next day.  Any side effects of medicines you are taking.  What you eat and drink.   Make your bedroom a comfortable place where it is easy to fall asleep:  Put up shades or special blackout curtains to block light from outside.  Use a white noise machine to block noise.  Keep the temperature cool.   Exercise regularly as directed by your health care provider. Avoid exercising right before bedtime.  Use relaxation techniques to manage stress. Ask your health care provider to suggest some techniques that may work well for you. These may include:  Breathing exercises.  Routines to release muscle tension.  Visualizing peaceful scenes.  Cut back on alcohol, caffeinated beverages, and cigarettes, especially close to bedtime. These can disrupt your sleep.  Do not overeat or eat spicy foods right before bedtime. This can lead to digestive discomfort that can make it hard for you to sleep.  Limit screen use before bedtime. This includes:  Watching TV.  Using your smartphone, tablet, and computer.  Stick to a routine. This   can help you fall asleep faster. Try to do a quiet activity, brush your teeth, and go to bed at the same time each night.  Get out of bed if you are still awake after 15 minutes of trying to sleep. Keep the lights down, but try reading or  doing a quiet activity. When you feel sleepy, go back to bed.  Make sure that you drive carefully. Avoid driving if you feel very sleepy.  Keep all follow-up appointments as directed by your health care provider. This is important. SEEK MEDICAL CARE IF:   You are tired throughout the day or have trouble in your daily routine due to sleepiness.  You continue to have sleep problems or your sleep problems get worse. SEEK IMMEDIATE MEDICAL CARE IF:   You have serious thoughts about hurting yourself or someone else.   This information is not intended to replace advice given to you by your health care provider. Make sure you discuss any questions you have with your health care provider.   Document Released: 09/14/2000 Document Revised: 06/08/2015 Document Reviewed: 06/18/2014 Elsevier Interactive Patient Education 2016 Elsevier Inc.  

## 2015-09-09 ENCOUNTER — Telehealth: Payer: Self-pay | Admitting: Neurology

## 2015-09-09 DIAGNOSIS — M6289 Other specified disorders of muscle: Secondary | ICD-10-CM

## 2015-09-09 LAB — RHEUMATOID FACTOR: Rhuematoid fact SerPl-aCnc: <10 IU/mL (ref 0.0–13.9)

## 2015-09-09 LAB — SEDIMENTATION RATE: SED RATE: 2 mm/h (ref 0–40)

## 2015-09-09 LAB — ANGIOTENSIN CONVERTING ENZYME: Angio Convert Enzyme: 98 U/L — ABNORMAL HIGH (ref 14–82)

## 2015-09-09 LAB — CK: CK TOTAL: 1037 U/L — AB (ref 24–173)

## 2015-09-09 LAB — ANA W/REFLEX: ANA: NEGATIVE

## 2015-09-09 NOTE — Telephone Encounter (Signed)
I called the patient. Blood work reveals a CK enzyme elevation of 1037. Angiotensin-converting enzyme level is also elevated. The patient will be set up for nerve conduction study and EMG evaluation. We will look at one arm and one leg. The patient used to be a smoker, but she does have a history of asthma, this could explain the elevation in the angiotensin-converting enzyme level.

## 2015-10-02 DIAGNOSIS — M778 Other enthesopathies, not elsewhere classified: Secondary | ICD-10-CM

## 2015-10-02 HISTORY — DX: Other enthesopathies, not elsewhere classified: M77.8

## 2015-10-18 ENCOUNTER — Telehealth: Payer: Self-pay | Admitting: Neurology

## 2015-10-18 DIAGNOSIS — E039 Hypothyroidism, unspecified: Secondary | ICD-10-CM

## 2015-10-18 DIAGNOSIS — M6289 Other specified disorders of muscle: Secondary | ICD-10-CM

## 2015-10-18 NOTE — Telephone Encounter (Signed)
I called the patient. We will recheck the CK level and ACE level. The patient has asthma, which may elevate the ACE. EMG is pending.  Orders for the blood work were placed.

## 2015-10-18 NOTE — Telephone Encounter (Signed)
Patient wants to know if she needs to get the CK enzyme, and Angiotensin-converting enzyme level lab work done again. Pt stated Dr. Patrecia Pace call her in December 2016 about the blood work being elevated. Pt stated she can get the lab work done at her job because its cheaper. Pt did not know if it was worth getting the labs again. Pt also wanted to know what would her portion for the EMG, nerve conduction be. Rn advised the patient to call back tomorrow and ask for accounting and they can help her. Pt verbalized understanding.

## 2015-10-18 NOTE — Telephone Encounter (Signed)
Patient called to request repeating labwork and prefers to do through [employer] where she is employed.

## 2015-10-19 NOTE — Telephone Encounter (Signed)
Rn call patient back and she wants the labs mail to her. Pt does not have fax machine at work. Rn stated Dr.Willis has to sign all the labs and they will be sent in the mail. Rn verified address.

## 2015-10-19 NOTE — Telephone Encounter (Signed)
Patient is returning your call. I advised her of the message that was left for her but she needed to speak with you.

## 2015-10-19 NOTE — Telephone Encounter (Signed)
LFt vm that labs can be fax to her job ifs its cheaper per conversation on 10/18/2015

## 2015-10-24 ENCOUNTER — Encounter: Payer: 59 | Admitting: Neurology

## 2015-10-27 ENCOUNTER — Other Ambulatory Visit: Payer: Self-pay | Admitting: Neurology

## 2015-10-29 LAB — CK: CK TOTAL: 476 U/L — AB (ref 24–173)

## 2015-10-29 LAB — TSH: TSH: 0.842 u[IU]/mL (ref 0.450–4.500)

## 2015-10-29 LAB — ANGIOTENSIN CONVERTING ENZYME: ANGIO CONVERT ENZYME: 82 U/L (ref 14–82)

## 2015-11-01 ENCOUNTER — Telehealth: Payer: Self-pay | Admitting: Neurology

## 2015-11-01 NOTE — Telephone Encounter (Signed)
Pt called said she reviewed labs from my chart is inquiring if she still needs NCV/EMG. She said CK levels are still elevated but lowered (previous were 1037 and presently are 476-nl is 24-173). She is inquiring what the difference is in the results.

## 2015-11-01 NOTE — Telephone Encounter (Signed)
I called the patient and explained that Dr. Anne Hahn does want her to have the EMG/NCV based on the notes. She verbalized understanding.

## 2015-11-22 ENCOUNTER — Ambulatory Visit (INDEPENDENT_AMBULATORY_CARE_PROVIDER_SITE_OTHER): Payer: Self-pay | Admitting: Neurology

## 2015-11-22 ENCOUNTER — Ambulatory Visit (INDEPENDENT_AMBULATORY_CARE_PROVIDER_SITE_OTHER): Payer: 59 | Admitting: Neurology

## 2015-11-22 ENCOUNTER — Encounter: Payer: Self-pay | Admitting: Neurology

## 2015-11-22 DIAGNOSIS — M6289 Other specified disorders of muscle: Secondary | ICD-10-CM

## 2015-11-22 DIAGNOSIS — G729 Myopathy, unspecified: Secondary | ICD-10-CM

## 2015-11-22 DIAGNOSIS — R413 Other amnesia: Secondary | ICD-10-CM

## 2015-11-22 NOTE — Progress Notes (Signed)
The patient comes in today for evaluation for muscle stiffness, elevated CK enzyme levels. There has been a modest elevation in the angiotensin-converting enzyme level.  Nerve conductions of the right arm and right leg were normal, EMG of the right leg was relatively unremarkable, there may be mild myopathic changes in the iliopsoas muscle. No other abnormalities were seen. A limited EMG of the right arm was unremarkable.  Plans are to follow the patient conservatively. A second CK enzyme level checked showed a markedly reduction into the mid 400 range. The patient may have benign hyperCKemia. We will check back in 4 months, repeat clinical examination and blood work testing.

## 2015-11-22 NOTE — Progress Notes (Signed)
Please refer to EMG and nerve conduction study procedure note. 

## 2015-11-22 NOTE — Procedures (Signed)
     HISTORY:  Latasha Bell is a 55 year old patient with a history of stiffness sensations in the arms and legs, some weakness with grip with the right hand. CK enzyme levels have been noted to be greater than 1000, a repeat study was in the mid 400 range. The patient comes in for an evaluation of a possible myopathic disorder.  NERVE CONDUCTION STUDIES:  Nerve conduction studies were performed on the right upper extremity. The distal motor latencies and motor amplitudes for the median and ulnar nerves were within normal limits. The F wave latencies and nerve conduction velocities for these nerves were also normal. The sensory latencies for the median and ulnar nerves were normal.  Nerve conduction studies were performed on the right lower extremity. The distal motor latencies and motor amplitudes for the peroneal and posterior tibial nerves were within normal limits. The nerve conduction velocities for these nerves were also normal. The H reflex latency was normal. The sensory latency for the peroneal nerve was within normal limits.   EMG STUDIES:  A limited EMG study was performed on the right upper extremity:  The first dorsal interosseous muscle reveals 2 to 4 K units with full recruitment. No fibrillations or positive waves were noted. The abductor pollicis brevis muscle reveals 2 to 4 K units with full recruitment. No fibrillations or positive waves were noted. The extensor indicis proprius muscle reveals 1 to 3 K units with full recruitment. No fibrillations or positive waves were noted.  EMG study was performed on the right lower extremity:  The tibialis anterior muscle reveals 2 to 4K motor units with full recruitment. No fibrillations or positive waves were seen. The peroneus tertius muscle reveals 2 to 4K motor units with full recruitment. No fibrillations or positive waves were seen. The medial gastrocnemius muscle reveals 1 to 3K motor units with full recruitment. No  fibrillations or positive waves were seen. The vastus lateralis muscle reveals 2 to 4K motor units with full recruitment. No fibrillations or positive waves were seen. The iliopsoas muscle reveals 1 to 2K motor units with full recruitment. No fibrillations or positive waves were seen. The biceps femoris muscle (long head) reveals 2 to 4K motor units with full recruitment. No fibrillations or positive waves were seen. The lumbosacral paraspinal muscles were tested at 3 levels, and revealed no abnormalities of insertional activity at all 3 levels tested. There was good relaxation.   IMPRESSION:  Nerve conduction studies done on the right arm and right leg were unremarkable, without evidence of a peripheral neuropathy. EMG study of the right lower extremity was relatively unremarkable, but the iliopsoas muscle revealed what could be mild myopathic changes, but otherwise the study was unremarkable. A limited EMG of the right upper extremity was unremarkable. A myopathy cannot be confidently diagnosed on this study. No evidence of neuropathic denervation was seen.  Marlan Palau MD 11/22/2015 5:22 PM  [facility] [address] [place], [place] [address]  Phone [phone] Fax [phone]

## 2015-11-28 ENCOUNTER — Ambulatory Visit: Payer: 59 | Admitting: Family Medicine

## 2015-12-21 ENCOUNTER — Telehealth: Payer: Self-pay

## 2015-12-21 MED ORDER — TRAZODONE HCL 50 MG PO TABS: 50.0000 mg | ORAL_TABLET | Freq: Every day | ORAL | Status: DC

## 2015-12-21 NOTE — Telephone Encounter (Signed)
Medication refill

## 2016-01-16 ENCOUNTER — Ambulatory Visit: Payer: 59 | Admitting: Family Medicine

## 2016-01-17 ENCOUNTER — Ambulatory Visit (INDEPENDENT_AMBULATORY_CARE_PROVIDER_SITE_OTHER): Payer: 59 | Admitting: Family Medicine

## 2016-01-17 ENCOUNTER — Encounter: Payer: Self-pay | Admitting: Family Medicine

## 2016-01-17 ENCOUNTER — Other Ambulatory Visit (INDEPENDENT_AMBULATORY_CARE_PROVIDER_SITE_OTHER): Payer: 59

## 2016-01-17 VITALS — BP 122/78 | HR 64 | Wt 182.0 lb

## 2016-01-17 DIAGNOSIS — M25521 Pain in right elbow: Secondary | ICD-10-CM | POA: Diagnosis not present

## 2016-01-17 DIAGNOSIS — S42433G Displaced fracture (avulsion) of lateral epicondyle of unspecified humerus, subsequent encounter for fracture with delayed healing: Secondary | ICD-10-CM | POA: Diagnosis not present

## 2016-01-17 MED ORDER — DICLOFENAC SODIUM 2 % TD SOLN: TRANSDERMAL | Status: DC

## 2016-01-17 NOTE — Progress Notes (Signed)
[facility] [address] [place], [place] [address] Phone: [phone] Subjective:    I'm seeing this patient by the request  of:  Betty Sw[place], MD   CC: right elbow pain  GEX:[id number] Latasha Bell is a 55 y.o. female coming in with complaint of right elbow pain. Has been going on for multiple months. Has seen other providers for. Recently had seen at night puncture has helped significant swelling in the form pain but now having localized pain over the elbow. Seems to hurt even to palpation. Still has certain movements of her. Mild weakness associated with it. Denies any numbness. Rates the severity of 7 out of 10. Patient has tried some over-the-counter medications without any significant improvement. Does not remember any true injury.     Past Medical History  Diagnosis Date  . Asthma   . Hypothyroid   . Hiatal hernia   . Thyroid nodule     solid  . S/P LEEP (loop electrosurgical excision procedure) 7/14    HGSIL  . Colon polyp   . Acid reflux   . Memory difficulties 05/02/2015  . Chronic insomnia 05/02/2015   Past Surgical History  Procedure Laterality Date  . Breast mass excision      2 breast bx's--fibrocystic changes  . Thyroidectomy, partial    . Hemorrhoid banding  03/2011    Dr. Kinnie Scales   Social History   Social History  . Marital Status: Unknown    Spouse Name: N/A  . Number of Children: 1  . Years of Education: MBA   Occupational History  . Lab [employer]    Social History Main Topics  . Smoking status: Former Smoker -- 0.50 packs/day for 15 years    Types: Cigarettes  . Smokeless tobacco: Never Used  . Alcohol Use: No     Comment: socially  . Drug Use: No  . Sexual Activity:    Partners: Male    Birth Control/ Protection: Post-menopausal   Other Topics Concern  . None   Social History Narrative   Patient drinks 3-4 cups of caffeine daily.   Patient is right handed.   No Known Allergies Family History  Problem  Relation Age of Onset  . Breast cancer Mother 42  . Colon cancer Father 45  . Cancer Brother     throat    Past medical history, social, surgical and family history all reviewed in electronic medical record.  No pertanent information unless stated regarding to the chief complaint.   Review of Systems: No headache, visual changes, nausea, vomiting, diarrhea, constipation, dizziness, abdominal pain, skin rash, fevers, chills, night sweats, weight loss, swollen lymph nodes, body aches, joint swelling, muscle aches, chest pain, shortness of breath, mood changes.   Objective Blood pressure 122/78, pulse 64, weight 182 lb (82.555 kg), last menstrual period 10/01/2008.  General: No apparent distress alert and oriented x3 mood and affect normal, dressed appropriately.  HEENT: Pupils equal, extraocular movements intact  Respiratory: Patient's speak in full sentences and does not appear short of breath  Cardiovascular: No lower extremity edema, non tender, no erythema  Skin: Warm dry intact with no signs of infection or rash on extremities or on axial skeleton.  Abdomen: Soft nontender  Neuro: Cranial nerves II through XII are intact, neurovascularly intact in all extremities with 2+ DTRs and 2+ pulses.  Lymph: No lymphadenopathy of posterior or anterior cervical chain or axillae bilaterally.  Gait normal with good balance and coordination.  MSK:  Non tender with full range of motion and good stability and symmetric strength and tone of shoulders,  wrist, hip, knee and ankles bilaterally.  Elbow:Right Unremarkable to inspection.mild bruising of the skin Range of motion full pronation, supination, flexion, extension. 4 out of 5 strength of extension of the wrist compared to the contralateral side. Full strength otherwise Stable to varus, valgus stress. Negative moving valgus stress test. Tender over the lateral patellar region where the bruising is. Pain with resisted extension of the wrist Ulnar  nerve does not sublux. Negative cubital tunnel Tinel's. Contralateral elbow unremarkable  Musculoskeletal ultrasound was performed and interpreted by Terrilee FilesZach Smith D.O.   Elbow:  Lateral epicondyle and common extensor tendon origin visualized.  Patient does have a very small avulsion noted with minimal displacement. Mild hypoechoic changes and increasing Doppler flow noted. Tendon itself seems to be unremarkable. Radial head unremarkable and located in annular ligament Medial epicondyle and common flexor tendon origin visualized.  No edema, effusions, or avulsions seen. Ulnar nerve in cubital tunnel unremarkable. Olecranon and triceps insertion visualized and unremarkable without edema, effusion, or avulsion.  No signs olecranon bursitis. Power doppler signal normal.  IMPRESSION:  Small avulsion of lateral condylar region but no true healing   Procedure note 97110; 15 minutes spent for Therapeutic exercises as stated in above notes.  This included exercises focusing on stretching, strengthening, with significant focus on eccentric aspects.  Lateral Epicondylitis: Elbow anatomy was reviewed, and tendinopathy was explained.  Pt. given a formal rehab program. Series of concentric and eccentric exercises should be done starting with no weight, work up to 1 lb, hammer, etc.  Use counterforce strap if working or using hands.  Formal PT would be beneficial. Emphasized stretching an cross-friction massage Emphasized proper palms up lifting biomechanics to unload ECRB  Proper technique shown and discussed handout in great detail with ATC.  All questions were discussed and answered.     Impression and Recommendations:     This case required medical decision making of moderate complexity.      Note: This dictation was prepared with Dragon dictation along with smaller phrase technology. Any transcriptional errors that result from this process are unintentional.

## 2016-01-17 NOTE — Assessment & Plan Note (Signed)
Noted on ultrasound today. Patient likely will heal. Patient given a wrist brace, topical anti-inflammatories, icing protocol. Work with Event organiserathletic trainer to learn home exercises. Discussed ergonomics and lifting he can experience follow-up in 3 weeks and we will ultrasound again to make sure we are responding appropriately.

## 2016-01-17 NOTE — Patient Instructions (Signed)
Good to see you.  Ice 20 minutes 2 times daily. Usually after activity and before bed. pennsaid pinkie amount topically 2 times daily as needed.  Once weekly vitamin D  Only lift palms up and thumbs up Turmeric 500mg  twice daily  Wear brace day and night for 2 weeks and nightly for 2 weeks See me again in 3 weeks and we will make sure you are healing.

## 2016-01-18 ENCOUNTER — Telehealth: Payer: Self-pay | Admitting: Family Medicine

## 2016-01-18 ENCOUNTER — Other Ambulatory Visit: Payer: Self-pay

## 2016-01-18 DIAGNOSIS — M25521 Pain in right elbow: Secondary | ICD-10-CM

## 2016-01-18 MED ORDER — VITAMIN D (ERGOCALCIFEROL) 1.25 MG (50000 UNIT) PO CAPS: 50000.0000 [IU] | ORAL_CAPSULE | ORAL | Status: DC

## 2016-01-18 NOTE — Telephone Encounter (Signed)
Pt called asking to see if Dr. Katrinka BlazingSmith is going to send in rx for Vit D or she suppose to get it over the counter. Please call her back

## 2016-01-18 NOTE — Telephone Encounter (Signed)
Spoke with patient. Sending into [facility] [address]

## 2016-01-30 DIAGNOSIS — R7309 Other abnormal glucose: Secondary | ICD-10-CM

## 2016-01-30 HISTORY — DX: Other abnormal glucose: R73.09

## 2016-01-31 ENCOUNTER — Other Ambulatory Visit: Payer: Self-pay

## 2016-01-31 DIAGNOSIS — Z1231 Encounter for screening mammogram for malignant neoplasm of breast: Secondary | ICD-10-CM

## 2016-02-07 ENCOUNTER — Other Ambulatory Visit: Payer: Self-pay

## 2016-02-07 ENCOUNTER — Telehealth: Payer: Self-pay | Admitting: Family Medicine

## 2016-02-07 ENCOUNTER — Other Ambulatory Visit: Payer: Self-pay | Admitting: *Deleted

## 2016-02-07 ENCOUNTER — Encounter: Payer: Self-pay | Admitting: Family Medicine

## 2016-02-07 ENCOUNTER — Ambulatory Visit (INDEPENDENT_AMBULATORY_CARE_PROVIDER_SITE_OTHER): Payer: 59 | Admitting: Family Medicine

## 2016-02-07 VITALS — BP 104/70 | HR 71 | Ht 66.0 in | Wt 181.0 lb

## 2016-02-07 DIAGNOSIS — M25521 Pain in right elbow: Secondary | ICD-10-CM

## 2016-02-07 DIAGNOSIS — S42433G Displaced fracture (avulsion) of lateral epicondyle of unspecified humerus, subsequent encounter for fracture with delayed healing: Secondary | ICD-10-CM | POA: Diagnosis not present

## 2016-02-07 MED ORDER — MELOXICAM 15 MG PO TABS: 15.0000 mg | ORAL_TABLET | Freq: Every day | ORAL | Status: DC

## 2016-02-07 MED ORDER — NITROGLYCERIN 0.2 MG/HR TD PT24: MEDICATED_PATCH | TRANSDERMAL | Status: DC

## 2016-02-07 NOTE — Progress Notes (Signed)
Pre visit review using our clinic review tool, if applicable. No additional management support is needed unless otherwise documented below in the visit note. 

## 2016-02-07 NOTE — Patient Instructions (Addendum)
Good to see you  Sorry for the bad news  Nitroglycerin Protocol   Apply 1/4 nitroglycerin patch to affected area daily.  Change position of patch within the affected area every 24 hours.  You may experience a headache during the first 1-2 weeks of using the patch, these should subside.  If you experience headaches after beginning nitroglycerin patch treatment, you may take your preferred over the counter pain reliever.  Another side effect of the nitroglycerin patch is skin irritation or rash related to patch adhesive.  Please notify our office if you develop more severe headaches or rash, and stop the patch.  Tendon healing with nitroglycerin patch may require 12 to 24 weeks depending on the extent of injury.  Men should not use if taking Viagra, Cialis, or Levitra.   Do not use if you have migraines or rosacea.  Meloxicam daily for 10 days then as needed Ice 20 minutes 2 times daily. Usually after activity and before bed. Consider being out of work and think about it PT will be calling you  See me again in 3-4 weeks or call me sooner if you would like MRI.

## 2016-02-07 NOTE — Telephone Encounter (Signed)
Patient is requesting MRI and referral to surgeon.

## 2016-02-07 NOTE — Telephone Encounter (Signed)
Order entered. [facility] will contact pt to schedule MRI.

## 2016-02-07 NOTE — Assessment & Plan Note (Signed)
Patient is having mild improvement in healing at this point. Encourage her to continue with the vitamin D. Patient does have a common extensor tear that seems to be worse. Patient was started on nitroglycerin. Will be sent to formal physical therapy. Oral anti-inflammatories given. Discussed icing regimen. Follow-up with me again in 3-4 weeks. If any increasing weakness patient will continue consider an MRI and then surgical intervention.

## 2016-02-07 NOTE — Progress Notes (Signed)
[facility] [address] [place], [place] [address] Phone: [phone] Subjective:    I'm seeing this patient by the request  of:  Betty Swazil[place], MD   CC: right elbow pain f/u  [id number] Latasha Bell is a 55 y.o. female coming in with complaint of right elbow pain. Patient was found to have an avulsion fracture with some mild intersubstance tearing of the lateral epicondylar region. Patient was to do bracing, home exercises icing and once weekly vitamin D. Patient states that the pain is actually increase. States that it seems to be giving her some weakness. Has been trying to avoid certain activities but finding it difficult. Especially when she is working. States on weekends a seems to get better and then gets worse throughout the week. Rates the severity pain now is 8 out of 10 and possibly worsening. Concerned because is her dominant arm and it is affecting daily activities.     Past Medical History  Diagnosis Date  . Asthma   . Hypothyroid   . Hiatal hernia   . Thyroid nodule     solid  . S/P LEEP (loop electrosurgical excision procedure) 7/14    HGSIL  . Colon polyp   . Acid reflux   . Memory difficulties 05/02/2015  . Chronic insomnia 05/02/2015   Past Surgical History  Procedure Laterality Date  . Breast mass excision      2 breast bx's--fibrocystic changes  . Thyroidectomy, partial    . Hemorrhoid banding  03/2011    Dr. Kinnie Scales   Social History   Social History  . Marital Status: Unknown    Spouse Name: N/A  . Number of Children: 1  . Years of Education: MBA   Occupational History  . Lab [employer]    Social History Main Topics  . Smoking status: Former Smoker -- 0.50 packs/day for 15 years    Types: Cigarettes  . Smokeless tobacco: Never Used  . Alcohol Use: No     Comment: socially  . Drug Use: No  . Sexual Activity:    Partners: Male    Birth Control/ Protection: Post-menopausal   Other Topics Concern  . None   Social  History Narrative   Patient drinks 3-4 cups of caffeine daily.   Patient is right handed.   No Known Allergies Family History  Problem Relation Age of Onset  . Breast cancer Mother 81  . Colon cancer Father 53  . Cancer Brother     throat    Past medical history, social, surgical and family history all reviewed in electronic medical record.  No pertanent information unless stated regarding to the chief complaint.   Review of Systems: No headache, visual changes, nausea, vomiting, diarrhea, constipation, dizziness, abdominal pain, skin rash, fevers, chills, night sweats, weight loss, swollen lymph nodes, body aches, joint swelling, muscle aches, chest pain, shortness of breath, mood changes.   Objective Blood pressure 104/70, pulse 71, height  (1.676 m), weight 181 lb (82.101 kg), last menstrual period 10/01/2008, SpO2 97 %.  General: No apparent distress alert and oriented x3 mood and affect normal, dressed appropriately.  HEENT: Pupils equal, extraocular movements intact  Respiratory: Patient's speak in full sentences and does not appear short of breath  Cardiovascular: No lower extremity edema, non tender, no erythema  Skin: Warm dry intact with no signs of infection or rash on extremities or on axial skeleton.  Abdomen: Soft nontender  Neuro: Cranial nerves II through XII are  intact, neurovascularly intact in all extremities with 2+ DTRs and 2+ pulses.  Lymph: No lymphadenopathy of posterior or anterior cervical chain or axillae bilaterally.  Gait normal with good balance and coordination.  MSK:  Non tender with full range of motion and good stability and symmetric strength and tone of shoulders,  wrist, hip, knee and ankles bilaterally.  Elbow:Right Swelling noted over the lateral epicondylar region Range of motion full pronation, supination, flexion, extension. 3+ out of 5 strength of extension of the wrist compared to the contralateral side. Worse than previous exam Stable  to varus, valgus stress. Negative moving valgus stress test. Tender over the lateral patellar region . Pain with resisted extension of the wrist Ulnar nerve does not sublux. Negative cubital tunnel Tinel's. Contralateral elbow unremarkable  Musculoskeletal ultrasound was performed and interpreted by Terrilee FilesZach Smith D.O.   Elbow:  Lateral epicondyle and common extensor tendon origin visualized.  Patient does have a very small avulsion noted with the displacement noted to be improved. Patient though unfortunately has worsening tear of the common extensor tendon. Greater than 50% of the tendon is torn. Mild retraction on the articular surface. Radial head unremarkable and located in annular ligament Medial epicondyle and common flexor tendon origin visualized.  No edema, effusions, or avulsions seen. Ulnar nerve in cubital tunnel unremarkable. Olecranon and triceps insertion visualized and unremarkable without edema, effusion, or avulsion.  No signs olecranon bursitis. Power doppler signal normal.  IMPRESSION:  Healing avulsion fracture the patient does have larger common extensor tear       Impression and Recommendations:     This case required medical decision making of moderate complexity.      Note: This dictation was prepared with Dragon dictation along with smaller phrase technology. Any transcriptional errors that result from this process are unintentional.

## 2016-02-08 ENCOUNTER — Telehealth: Payer: Self-pay | Admitting: Family Medicine

## 2016-02-08 NOTE — Telephone Encounter (Signed)
Spoke to pt, i explained to her that since she decided to have the MRI. We cancelled the PT order. We're waiting on MRI results & then will go from there. Pt understood.

## 2016-02-08 NOTE — Telephone Encounter (Signed)
Pt request to speak to the assistant concern about PT.

## 2016-02-13 ENCOUNTER — Telehealth: Payer: Self-pay | Admitting: Family Medicine

## 2016-02-13 NOTE — Telephone Encounter (Signed)
Pt called stated she decide to take sometime off of work due to numbness in her fingers. Pt just want to inform you that her HR will be faxing paper work to our office today at fax # [phone]. Please look for this fax.

## 2016-02-13 NOTE — Telephone Encounter (Signed)
Spoke to pt, she is having her MRI on 5.17.17 & that is when she told her work that she will be going on. i advised pt that depending on the results of the MRI, we will go from there. Pt understood.

## 2016-02-15 ENCOUNTER — Ambulatory Visit
Admission: RE | Admit: 2016-02-15 | Discharge: 2016-02-15 | Disposition: A | Discharge status: Home / Self Care | Payer: 59 | Source: Ambulatory Visit | Attending: Family Medicine | Admitting: Family Medicine

## 2016-02-15 DIAGNOSIS — M25521 Pain in right elbow: Secondary | ICD-10-CM

## 2016-02-16 ENCOUNTER — Telehealth: Payer: Self-pay | Admitting: Family Medicine

## 2016-02-16 DIAGNOSIS — M25521 Pain in right elbow: Secondary | ICD-10-CM

## 2016-02-16 NOTE — Telephone Encounter (Signed)
Spoke to pt, she stated she will take her MRI report to [facility] to see if they feel comfortable rehabing the tear. Pt stated she will let me know by the end of the day if she would like to try PT or be referred to a surgeon.

## 2016-02-16 NOTE — Telephone Encounter (Signed)
Pt called in and wanted to talk with nurse about her mri results.  She looked at them on my chart but wants to talk about them

## 2016-02-17 ENCOUNTER — Telehealth: Payer: Self-pay | Admitting: Family Medicine

## 2016-02-17 NOTE — Telephone Encounter (Signed)
Would like requesting for PT to be sent

## 2016-02-17 NOTE — Telephone Encounter (Signed)
PT referral sent to [facility].

## 2016-02-17 NOTE — Telephone Encounter (Signed)
Duplicate phone note.

## 2016-02-20 ENCOUNTER — Encounter: Payer: Self-pay | Admitting: Family Medicine

## 2016-02-20 ENCOUNTER — Other Ambulatory Visit: Payer: Self-pay | Admitting: Emergency Medicine

## 2016-02-20 ENCOUNTER — Ambulatory Visit (INDEPENDENT_AMBULATORY_CARE_PROVIDER_SITE_OTHER): Payer: 59 | Admitting: Family Medicine

## 2016-02-20 VITALS — BP 113/69 | HR 60 | Temp 98.0°F | Ht 66.25 in | Wt 179.6 lb

## 2016-02-20 DIAGNOSIS — E890 Postprocedural hypothyroidism: Secondary | ICD-10-CM

## 2016-02-20 DIAGNOSIS — Z130 Encounter for screening for diseases of the blood and blood-forming organs and certain disorders involving the immune mechanism: Secondary | ICD-10-CM

## 2016-02-20 DIAGNOSIS — Z119 Encounter for screening for infectious and parasitic diseases, unspecified: Secondary | ICD-10-CM

## 2016-02-20 DIAGNOSIS — K219 Gastro-esophageal reflux disease without esophagitis: Secondary | ICD-10-CM | POA: Diagnosis not present

## 2016-02-20 DIAGNOSIS — Z23 Encounter for immunization: Secondary | ICD-10-CM

## 2016-02-20 DIAGNOSIS — Z13220 Encounter for screening for lipoid disorders: Secondary | ICD-10-CM

## 2016-02-20 DIAGNOSIS — F5104 Psychophysiologic insomnia: Secondary | ICD-10-CM

## 2016-02-20 DIAGNOSIS — G4700 Insomnia, unspecified: Secondary | ICD-10-CM | POA: Diagnosis not present

## 2016-02-20 DIAGNOSIS — F325 Major depressive disorder, single episode, in full remission: Secondary | ICD-10-CM | POA: Diagnosis not present

## 2016-02-20 DIAGNOSIS — Z131 Encounter for screening for diabetes mellitus: Secondary | ICD-10-CM

## 2016-02-20 LAB — BASIC METABOLIC PANEL
BUN: 12 mg/dL (ref 4–21)
Creatinine: 0.8 mg/dL (ref <=1.1)
Glucose: 90 mg/dL
Potassium: 3.8 mmol/L (ref 3.4–5.3)
Sodium: 140 mmol/L (ref 137–147)

## 2016-02-20 LAB — HEPATIC FUNCTION PANEL
ALT: 15 U/L (ref 7–35)
AST: 18 U/L (ref 13–35)
Alkaline Phosphatase: 78 U/L (ref 25–125)
BILIRUBIN, TOTAL: 0.5 mg/dL

## 2016-02-20 LAB — LIPID PANEL
LDl/HDL Ratio: 3.2
Triglycerides: 93 mg/dL (ref 40–160)

## 2016-02-20 LAB — CBC AND DIFFERENTIAL
HEMATOCRIT: 36 % (ref 36–46)
Hemoglobin: 12.4 g/dL (ref 12.0–16.0)
WBC: 4.7 10*3/mL

## 2016-02-20 LAB — HEMOGLOBIN A1C: Hemoglobin A1C: 5.9

## 2016-02-20 LAB — TSH: TSH: 1.32 u[IU]/mL (ref <=5.90)

## 2016-02-20 MED ORDER — TRAZODONE HCL 50 MG PO TABS
ORAL_TABLET | ORAL | Status: DC
Start: 2016-02-20 — End: 2019-07-23

## 2016-02-20 MED ORDER — BUPROPION HCL ER (SR) 150 MG PO TB12: 150.0000 mg | ORAL_TABLET | Freq: Two times a day (BID) | ORAL | Status: DC

## 2016-02-20 MED ORDER — OMEPRAZOLE 40 MG PO CPDR: 40.0000 mg | DELAYED_RELEASE_CAPSULE | Freq: Every day | ORAL | Status: DC

## 2016-02-20 MED ORDER — LEVOTHYROXINE SODIUM 100 MCG PO TABS: 100.0000 ug | ORAL_TABLET | Freq: Every day | ORAL | Status: DC

## 2016-02-20 NOTE — Patient Instructions (Signed)
It was very nice to meet you today!  I will be in touch with your labs asap Try stopping the prilosec- if symptoms come back you might try and H2 blocker such as zantac OTC; this may be safer for long term use Increase your trazodone to 75 mg at bedtime and see if this is more effective for you  You got your Tdap shot today (tetanus and whooping cough booster)

## 2016-02-20 NOTE — Progress Notes (Signed)
[facility] [address], [address] [place], [place] [address] [phone]  Date:  02/20/2016   Name:  Latasha Bell   DOB:  Apr 08, 1961   MRN:  [id number]  PCP:  Abbe Amsterdam, MD    Chief Complaint: Establish Care   History of Present Illness:  Latasha Bell is a 54 y.o. very pleasant female patient who presents with the following: Establishing as a new pt today History of memory problems; saw Dr. Anne Hahn. It turned out to be that she was not sleeping well- her fitbit showed that she was sleeping 2-3 hours a night. She started taking trazodone which helps her sleep and her memory issues are now resolved.  Trazodone helps her to get to sleep but she may wake up and be up for a couple of  hours during the night. Wonders if something else might be better for sleep but overall she does like the trazodone. Has had HGSIL s/p LEEP in 2015; has OBG care.post menopausal  No recent labs except for a thyroid level in January She is s/p partial thyroidectomy for benign cysts  She did have a tetanus in 2005- unsure if td or tdap  Quit smoking 9 months ago. Her wheezing has stopped - she stopped her advair as she no longer has sx since quitting tobacco She started on wellbutrin for smoking but has just continue to take it. She would like to continue taking it for now as she feels good and wants to be certain she does not go back to smoking Patient Active Problem List   Diagnosis Date Noted  . Displaced avulsion fracture of lateral epicondyle of humerus with delayed healing 01/17/2016  . Memory difficulties 05/02/2015  . Chronic insomnia 05/02/2015  . S/P LEEP 03/29/2014  . AGUS favor dysplasia 02/24/2013  . Unspecified hypothyroidism 02/24/2013    Past Medical History  Diagnosis Date  . Asthma   . Hypothyroid   . Hiatal hernia   . Thyroid nodule     solid  . S/P LEEP (loop electrosurgical excision procedure) 7/14    HGSIL  .  Colon polyp   . Acid reflux   . Memory difficulties 05/02/2015  . Chronic insomnia 05/02/2015    Past Surgical History  Procedure Laterality Date  . Breast mass excision      2 breast bx's--fibrocystic changes  . Thyroidectomy, partial    . Hemorrhoid banding  03/2011    Dr. Kinnie Scales    Social History  Substance Use Topics  . Smoking status: Former Smoker -- 0.50 packs/day for 15 years    Types: Cigarettes  . Smokeless tobacco: Never Used  . Alcohol Use: No     Comment: socially    Family History  Problem Relation Age of Onset  . Breast cancer Mother 36  . Colon cancer Father 39  . Cancer Brother     throat    No Known Allergies  Medication list has been reviewed and updated.  Current Outpatient Prescriptions on File Prior to Visit  Medication Sig Dispense Refill  . buPROPion (WELLBUTRIN SR) 150 MG 12 hr tablet Take 150 mg by mouth 2 (two) times daily.    Marland Kitchen levothyroxine (SYNTHROID, LEVOTHROID) 100 MCG tablet Take 100 mcg by mouth daily before breakfast. 1 tablet Monday-Saturday and 1/2 tablet Sunday    . meloxicam (MOBIC) 15 MG tablet Take 1 tablet (15 mg total) by mouth daily. 30 tablet 0  . Multiple  Vitamin (MULTIVITAMIN) capsule Take 1 capsule by mouth daily.    . nitroGLYCERIN (NITRODUR - DOSED IN MG/24 HR) 0.2 mg/hr patch 1/4 patch daily 30 patch 1  . omeprazole (PRILOSEC) 40 MG capsule Take 40 mg by mouth daily.    . traZODone (DESYREL) 50 MG tablet Take 1 tablet (50 mg total) by mouth at bedtime. 90 tablet 3  . Vitamin D, Ergocalciferol, (DRISDOL) 50000 units CAPS capsule Take 1 capsule (50,000 Units total) by mouth every 7 (seven) days. 12 capsule 0  . Fluticasone-Salmeterol (ADVAIR) 100-50 MCG/DOSE AEPB Inhale 1 puff into the lungs 2 (two) times daily. Reported on 02/20/2016     No current facility-administered medications on file prior to visit.    Review of Systems:  As per HPI- otherwise negative.   Physical Examination: Filed Vitals:   02/20/16 1546   BP: 113/69  Pulse: 60  Temp: 98 F (36.7 C)   Filed Vitals:   02/20/16 1546  Height: 5' 6.25" (1.683 m)  Weight: 179 lb 9.6 oz (81.466 kg)   Body mass index is 28.76 kg/(m^2). Ideal Body Weight: Weight in (lb) to have BMI = 25: 155.7  GEN: WDWN, NAD, Non-toxic, A & O x 3, looks well, mild overweight HEENT: Atraumatic, Normocephalic. Neck supple. No masses, No LAD. Ears and Nose: No external deformity. CV: RRR, No M/G/R. No JVD. No thrill. No extra heart sounds. PULM: CTA B, no wheezes, crackles, rhonchi. No retractions. No resp. distress. No accessory muscle use. EXTR: No c/c/e NEURO Normal gait.  PSYCH: Normally interactive. Conversant. Not depressed or anxious appearing.  Calm demeanor.    Assessment and Plan: Postoperative hypothyroidism - Plan: levothyroxine (SYNTHROID, LEVOTHROID) 100 MCG tablet, TSH  Major depressive disorder with single episode, in full remission (HCC) - Plan: buPROPion (WELLBUTRIN SR) 150 MG 12 hr tablet  Gastroesophageal reflux disease without esophagitis - Plan: omeprazole (PRILOSEC) 40 MG capsule  Chronic insomnia - Plan: traZODone (DESYREL) 50 MG tablet  Immunization due - Plan: Tdap vaccine greater than or equal to 7yo IM  Screening for diabetes mellitus - Plan: Comprehensive metabolic panel, Hemoglobin A1c  Screening for hyperlipidemia - Plan: Lipid panel  Screening for deficiency anemia - Plan: CBC  Screening examination for infectious disease - Plan: Hepatitis C antibody  It was very nice to meet you today!  I will be in touch with your labs asap Try stopping the prilosec- if symptoms come back you might try and H2 blocker such as zantac OTC; this may be safer for long term use Increase your trazodone to 75 mg at bedtime and see if this is more effective for you  You got your Tdap shot today (tetanus and whooping cough booster)  Will plan further follow- up pending labs. Did refills for her today  Meds ordered this encounter   Medications  . buPROPion (WELLBUTRIN SR) 150 MG 12 hr tablet    Sig: Take 1 tablet (150 mg total) by mouth 2 (two) times daily.    Dispense:  180 tablet    Refill:  3  . levothyroxine (SYNTHROID, LEVOTHROID) 100 MCG tablet    Sig: Take 1 tablet (100 mcg total) by mouth daily before breakfast. 1 tablet Monday-Saturday and 1/2 tablet Sunday    Dispense:  90 tablet    Refill:  3  . omeprazole (PRILOSEC) 40 MG capsule    Sig: Take 1 capsule (40 mg total) by mouth daily.    Dispense:  90 capsule    Refill:  3  .  traZODone (DESYREL) 50 MG tablet    Sig: Take 1 or 1.5 tablets nightly for insomnia    Dispense:  145 tablet    Refill:  3      Signed Abbe Amsterdam, MD

## 2016-02-20 NOTE — Progress Notes (Signed)
Pre visit review using our clinic review tool, if applicable. No additional management support is needed unless otherwise documented below in the visit note. 

## 2016-02-21 ENCOUNTER — Telehealth: Payer: Self-pay | Admitting: Family Medicine

## 2016-02-21 NOTE — Telephone Encounter (Signed)
I am fine with that  Please write script.  It should not hurt her.

## 2016-02-21 NOTE — Telephone Encounter (Signed)
Would like to be sent to a different place for PT.  She would like to go to physical therapist and had specialist at [address]  States this would require a written script.  Patient states she would like [facility] treatment.  Would like to know what Dr. Katrinka BlazingSmith thought of that.

## 2016-02-21 NOTE — Telephone Encounter (Signed)
Discussed with pt. Referral sent to [facility].

## 2016-02-22 ENCOUNTER — Other Ambulatory Visit: Payer: Self-pay | Admitting: Family Medicine

## 2016-02-22 ENCOUNTER — Encounter: Payer: Self-pay | Admitting: Family Medicine

## 2016-02-22 ENCOUNTER — Telehealth: Payer: Self-pay

## 2016-02-22 DIAGNOSIS — R7303 Prediabetes: Secondary | ICD-10-CM | POA: Insufficient documentation

## 2016-02-22 NOTE — Telephone Encounter (Signed)
Patient Spoke to [place] last week, about her short term disability needing to be sent to her PCP. Could you please follow up. THank you.

## 2016-02-22 NOTE — Telephone Encounter (Signed)
Spoke to pt, advised her i faxed in her fmla yesterday.

## 2016-02-23 ENCOUNTER — Telehealth: Payer: Self-pay | Admitting: Family Medicine

## 2016-02-23 NOTE — Telephone Encounter (Signed)
Pt called in and would like for you to give her a call when you get a chance about some paper work she said that Dr Katrinka Blazingsmith was filling out?

## 2016-02-23 NOTE — Telephone Encounter (Signed)
Spoke to pt, advised her refaxed fmla form to work. Pt understood.

## 2016-02-28 ENCOUNTER — Encounter: Payer: Self-pay | Admitting: Family Medicine

## 2016-02-28 ENCOUNTER — Ambulatory Visit (INDEPENDENT_AMBULATORY_CARE_PROVIDER_SITE_OTHER): Payer: 59 | Admitting: Family Medicine

## 2016-02-28 ENCOUNTER — Other Ambulatory Visit: Payer: Self-pay

## 2016-02-28 VITALS — BP 116/76 | HR 66 | Wt 175.0 lb

## 2016-02-28 DIAGNOSIS — S42433G Displaced fracture (avulsion) of lateral epicondyle of unspecified humerus, subsequent encounter for fracture with delayed healing: Secondary | ICD-10-CM | POA: Diagnosis not present

## 2016-02-28 DIAGNOSIS — M25521 Pain in right elbow: Secondary | ICD-10-CM

## 2016-02-28 MED ORDER — VITAMIN D (ERGOCALCIFEROL) 1.25 MG (50000 UNIT) PO CAPS: 50000.0000 [IU] | ORAL_CAPSULE | ORAL | Status: DC

## 2016-02-28 NOTE — Patient Instructions (Signed)
Good to see you  You are doing great  Continue the exercises 2-3 times a week  I think the 5th should be perfect  See me again 1-2 weeks after you start working to make sure you are good.

## 2016-02-28 NOTE — Assessment & Plan Note (Signed)
Patient is doing well at this time. Ultrasound shows a 90% healing with some good callus formation over the avulsion fracture. We discussed icing regimen. Continue vitamin D. Given to return to work in one week. Will follow up 1-2 weeks afterwards. Spent  25 minutes with patient face-to-face and had greater than 50% of counseling including as described above in assessment and plan.

## 2016-02-28 NOTE — Progress Notes (Signed)
[facility] [address] [place], [place] [address] Phone: [phone] Subjective:    I'm seeing this patient by the request  of:  COPLAND,JESSICA, MD   CC: right elbow pain f/u  [id number] Latasha Bell is a 55 y.o. female coming in with complaint of right elbow pain. Patient was found to have an avulsion fracture with some mild intersubstance tearing of the lateral epicondylar region. Patient initially seemed to get worse at her next follow-up. Patient has been out of work for the last 2 weeks and doing vitamin D as well as nitrogen. No side effects and is feeling significantly better. States that she is feeling 85% better. Has been doing physical therapy and is doing e-stim.   No new symptoms.    Past Medical History  Diagnosis Date  . Asthma   . Hypothyroid   . Hiatal hernia   . Thyroid nodule     solid  . S/P LEEP (loop electrosurgical excision procedure) 7/14    HGSIL  . Colon polyp   . Acid reflux   . Memory difficulties 05/02/2015  . Chronic insomnia 05/02/2015   Past Surgical History  Procedure Laterality Date  . Breast mass excision      2 breast bx's--fibrocystic changes  . Thyroidectomy, partial    . Hemorrhoid banding  03/2011    Dr. Kinnie Scales   Social History   Social History  . Marital Status: Divorced    Spouse Name: N/A  . Number of Children: 1  . Years of Education: MBA   Occupational History  . Lab [employer]    Social History Main Topics  . Smoking status: Former Smoker -- 0.50 packs/day for 15 years    Types: Cigarettes  . Smokeless tobacco: Never Used  . Alcohol Use: No     Comment: socially  . Drug Use: No  . Sexual Activity:    Partners: Male    Birth Control/ Protection: Post-menopausal   Other Topics Concern  . None   Social History Narrative   Patient drinks 3-4 cups of caffeine daily.   Patient is right handed.   No Known Allergies Family History  Problem Relation Age of Onset  . Breast cancer  Mother 53  . Colon cancer Father 16  . Cancer Brother     throat    Past medical history, social, surgical and family history all reviewed in electronic medical record.  No pertanent information unless stated regarding to the chief complaint.   Review of Systems: No headache, visual changes, nausea, vomiting, diarrhea, constipation, dizziness, abdominal pain, skin rash, fevers, chills, night sweats, weight loss, swollen lymph nodes, body aches, joint swelling, muscle aches, chest pain, shortness of breath, mood changes.   Objective Blood pressure 116/76, pulse 66, weight 175 lb (79.379 kg), last menstrual period 10/01/2008.  General: No apparent distress alert and oriented x3 mood and affect normal, dressed appropriately.  HEENT: Pupils equal, extraocular movements intact  Respiratory: Patient's speak in full sentences and does not appear short of breath  Cardiovascular: No lower extremity edema, non tender, no erythema  Skin: Warm dry intact with no signs of infection or rash on extremities or on axial skeleton.  Abdomen: Soft nontender  Neuro: Cranial nerves II through XII are intact, neurovascularly intact in all extremities with 2+ DTRs and 2+ pulses.  Lymph: No lymphadenopathy of posterior or anterior cervical chain or axillae bilaterally.  Gait normal with good balance and coordination.  MSK:  Non tender  with full range of motion and good stability and symmetric strength and tone of shoulders,  wrist, hip, knee and ankles bilaterally.  Elbow:Right No swelling noted today Range of motion full pronation, supination, flexion, extension. 4+ out of 5 strength of extension of the wrist compared to the contralateral side. Worse than previous exam Stable to varus, valgus stress. Negative moving valgus stress test. Minimal tenderness over the lateral epicondylar region Ulnar nerve does not sublux. Negative cubital tunnel Tinel's. Contralateral elbow unremarkable  Musculoskeletal  ultrasound was performed and interpreted by Terrilee FilesZach Smith D.O.   Elbow:  Lateral epicondyle and common extensor tendon origin visualized.  Callus seems that is now hard and tear significantly better still about 10% to heal.  Radial head unremarkable and located in annular ligament Medial epicondyle and common flexor tendon origin visualized.  No edema, effusions, or avulsions seen. Ulnar nerve in cubital tunnel unremarkable. Olecranon and triceps insertion visualized and unremarkable without edema, effusion, or avulsion.  No signs olecranon bursitis. Power doppler signal increased  IMPRESSION:  Significant interval healing.        Impression and Recommendations:     This case required medical decision making of moderate complexity.      Note: This dictation was prepared with Dragon dictation along with smaller phrase technology. Any transcriptional errors that result from this process are unintentional.

## 2016-03-06 ENCOUNTER — Encounter: Payer: Self-pay | Admitting: Family Medicine

## 2016-03-09 ENCOUNTER — Ambulatory Visit: Payer: 59 | Admitting: Family Medicine

## 2016-03-09 ENCOUNTER — Encounter: Payer: Self-pay | Admitting: Obstetrics and Gynecology

## 2016-03-09 ENCOUNTER — Ambulatory Visit (INDEPENDENT_AMBULATORY_CARE_PROVIDER_SITE_OTHER): Payer: 59 | Admitting: Obstetrics and Gynecology

## 2016-03-09 VITALS — BP 100/62 | HR 60 | Resp 14 | Ht 66.5 in | Wt 175.8 lb

## 2016-03-09 DIAGNOSIS — Z0000 Encounter for general adult medical examination without abnormal findings: Secondary | ICD-10-CM | POA: Diagnosis not present

## 2016-03-09 DIAGNOSIS — Z01419 Encounter for gynecological examination (general) (routine) without abnormal findings: Secondary | ICD-10-CM

## 2016-03-09 LAB — POCT URINALYSIS DIPSTICK
BILIRUBIN UA: NEGATIVE
Glucose, UA: NEGATIVE
KETONES UA: NEGATIVE
Leukocytes, UA: NEGATIVE
Nitrite, UA: NEGATIVE
PH UA: 5
Protein, UA: NEGATIVE
RBC UA: NEGATIVE
Urobilinogen, UA: NEGATIVE

## 2016-03-09 NOTE — Progress Notes (Signed)
Patient ID: Latasha Bell, female   DOB: 08-14-1961, 55 y.o.   MRN: [id number] 55 y.o. G2P1 Divorced Caucasian female here for annual exam.    Wants a pap today.   Had an elbow fracture and then tendinitis.   Trouble sleeping.  Takes Trazadone.   PCP:   Abbe Amsterdam, MD  Patient's last menstrual period was 10/01/2008.           Sexually active: Yes.   female The current method of family planning is post menopausal status.    Exercising: Yes.    step class 3days/week. Smoker:  no  Health Maintenance: Pap:  06-10-15 Neg:Neg HR HPV History of abnormal Pap:  Yes,04/09/13 LEEP: LGSIL, margins negative. Prior history to this: Pap 02/27/13 - AGUS and ASCUS-H and positive HR HPV. Colpo 03/23/13 - bx HGSIL, ECC negative, EMB negative endometrium and a strip of LGSIL.  MMG:  06-24-15 3D/Density B/Neg/BiRads1:[facility] Colonoscopy:  03/2014 normal with Dr. Neill Loft due 03/2019 due to hx of polyps. BMD:   04-01-12  Result  Normal:[facility] TDaP:  01/2016 Gardasil:   N/A HIV:  Neg - 2016 Hep C:  Neg - 2017 Screening Labs:  Hb today: PCP, Urine today: Neg   reports that she has quit smoking. Her smoking use included Cigarettes. She has a 7.5 pack-year smoking history. She has never used smokeless tobacco. She reports that she drinks about 1.8 oz of alcohol per week. She reports that she does not use illicit drugs.  Past Medical History  Diagnosis Date  . Asthma   . Hypothyroid   . Hiatal hernia   . Thyroid nodule     solid  . S/P LEEP (loop electrosurgical excision procedure) 7/14    HGSIL  . Colon polyp   . Acid reflux   . Memory difficulties 05/02/2015  . Chronic insomnia 05/02/2015  . Tendonitis of elbow, right 2017    Past Surgical History  Procedure Laterality Date  . Breast mass excision      2 breast bx's--fibrocystic changes  . Thyroidectomy, partial    . Hemorrhoid banding  03/2011    Dr. Kinnie Scales    Current Outpatient Prescriptions  Medication Sig  Dispense Refill  . buPROPion (WELLBUTRIN SR) 150 MG 12 hr tablet Take 1 tablet (150 mg total) by mouth 2 (two) times daily. 180 tablet 3  . levothyroxine (SYNTHROID, LEVOTHROID) 100 MCG tablet Take 1 tablet (100 mcg total) by mouth daily before breakfast. 1 tablet Monday-Saturday and 1/2 tablet Sunday 90 tablet 3  . Multiple Vitamin (MULTIVITAMIN) capsule Take 1 capsule by mouth daily.    . nitroGLYCERIN (NITRODUR - DOSED IN MG/24 HR) 0.2 mg/hr patch 1/4 patch daily 30 patch 1  . omeprazole (PRILOSEC) 40 MG capsule Take 1 capsule (40 mg total) by mouth daily. 90 capsule 3  . traZODone (DESYREL) 50 MG tablet Take 1 or 1.5 tablets nightly for insomnia 145 tablet 3  . Vitamin D, Ergocalciferol, (DRISDOL) 50000 units CAPS capsule Take 1 capsule (50,000 Units total) by mouth every 7 (seven) days. 12 capsule 1   No current facility-administered medications for this visit.    Family History  Problem Relation Age of Onset  . Breast cancer Mother 32  . Colon cancer Father 25  . Cancer Brother     throat    ROS:  Pertinent items are noted in HPI.  Otherwise, a comprehensive ROS was negative.  Exam:   BP 100/62 mmHg  Pulse 60  Resp 14  Ht 5' 6.5" (1.689 m)  Wt 175 lb 12.8 oz (79.742 kg)  BMI 27.95 kg/m2  LMP 10/01/2008    General appearance: alert, cooperative and appears stated age Head: Normocephalic, without obvious abnormality, atraumatic Neck: no adenopathy, supple, symmetrical, trachea midline and thyroid normal to inspection and palpation Lungs: clear to auscultation bilaterally Breasts: normal appearance, no masses or tenderness, Inspection negative, No nipple retraction or dimpling, No nipple discharge or bleeding, No axillary or supraclavicular adenopathy Heart: regular rate and rhythm Abdomen: incisions:  No.    , soft, non-tender; no masses, no organomegaly Extremities: extremities normal, atraumatic, no cyanosis or edema Skin: Skin color, texture, turgor normal. No rashes or  lesions Lymph nodes: Cervical, supraclavicular, and axillary nodes normal. No abnormal inguinal nodes palpated Neurologic: Grossly normal  Pelvic: External genitalia:  no lesions              Urethra:  normal appearing urethra with no masses, tenderness or lesions              Bartholins and Skenes: normal                 Vagina: normal appearing vagina with normal color and discharge, no lesions              Cervix: no lesions and consistent with LEEP.  Os closed.              Pap taken: Yes.   Bimanual Exam:  Uterus:  normal size, contour, position, consistency, mobility, non-tender              Adnexa: normal adnexa and no mass, fullness, tenderness              Rectal exam: Yes.  .  Confirms.              Anus:  normal sphincter tone, no lesions  Chaperone was present for exam.  Assessment:   Well woman visit with normal exam. Hx LEEP with HGSIL. Hx prior AGUS pap.  Insomnia.   Plan: Yearly mammogram recommended after age 56.  Recommended self breast exam.  Pap and HR HPV as above. Discussed Calcium, Vitamin D, regular exercise program including cardiovascular and weight bearing exercise. Labs performed.  No..     Prescription medication(s) given.  No..    She will contact her PCP about changing her sleeping aide. Avoid caffeine.  Don't exercise just before bed.  Watch ETOH use.  Follow up annually and prn.      After visit summary provided.

## 2016-03-09 NOTE — Patient Instructions (Signed)
Health Maintenance, Female Adopting a healthy lifestyle and getting preventive care can go a long way to promote health and wellness. Talk with your health care provider about what schedule of regular examinations is right for you. This is a good chance for you to check in with your provider about disease prevention and staying healthy. In between checkups, there are plenty of things you can do on your own. Experts have done a lot of research about which lifestyle changes and preventive measures are most likely to keep you healthy. Ask your health care provider for more information. WEIGHT AND DIET  Eat a healthy diet  Be sure to include plenty of vegetables, fruits, low-fat dairy products, and lean protein.  Do not eat a lot of foods high in solid fats, added sugars, or salt.  Get regular exercise. This is one of the most important things you can do for your health.  Most adults should exercise for at least 150 minutes each week. The exercise should increase your heart rate and make you sweat (moderate-intensity exercise).  Most adults should also do strengthening exercises at least twice a week. This is in addition to the moderate-intensity exercise.  Maintain a healthy weight  Body mass index (BMI) is a measurement that can be used to identify possible weight problems. It estimates body fat based on height and weight. Your health care provider can help determine your BMI and help you achieve or maintain a healthy weight.  For females 20 years of age and older:   A BMI below 18.5 is considered underweight.  A BMI of 18.5 to 24.9 is normal.  A BMI of 25 to 29.9 is considered overweight.  A BMI of 30 and above is considered obese.  Watch levels of cholesterol and blood lipids  You should start having your blood tested for lipids and cholesterol at 56 years of age, then have this test every 5 years.  You may need to have your cholesterol levels checked more often if:  Your lipid  or cholesterol levels are high.  You are older than 56 years of age.  You are at high risk for heart disease.  CANCER SCREENING   Lung Cancer  Lung cancer screening is recommended for adults 55-80 years old who are at high risk for lung cancer because of a history of smoking.  A yearly low-dose CT scan of the lungs is recommended for people who:  Currently smoke.  Have quit within the past 15 years.  Have at least a 30-pack-year history of smoking. A pack year is smoking an average of one pack of cigarettes a day for 1 year.  Yearly screening should continue until it has been 15 years since you quit.  Yearly screening should stop if you develop a health problem that would prevent you from having lung cancer treatment.  Breast Cancer  Practice breast self-awareness. This means understanding how your breasts normally appear and feel.  It also means doing regular breast self-exams. Let your health care provider know about any changes, no matter how small.  If you are in your 20s or 30s, you should have a clinical breast exam (CBE) by a health care provider every 1-3 years as part of a regular health exam.  If you are 40 or older, have a CBE every year. Also consider having a breast X-ray (mammogram) every year.  If you have a family history of breast cancer, talk to your health care provider about genetic screening.  If you   are at high risk for breast cancer, talk to your health care provider about having an MRI and a mammogram every year.  Breast cancer gene (BRCA) assessment is recommended for women who have family members with BRCA-related cancers. BRCA-related cancers include:  Breast.  Ovarian.  Tubal.  Peritoneal cancers.  Results of the assessment will determine the need for genetic counseling and BRCA1 and BRCA2 testing. Cervical Cancer Your health care provider may recommend that you be screened regularly for cancer of the pelvic organs (ovaries, uterus, and  vagina). This screening involves a pelvic examination, including checking for microscopic changes to the surface of your cervix (Pap test). You may be encouraged to have this screening done every 3 years, beginning at age 21.  For women ages 30-65, health care providers may recommend pelvic exams and Pap testing every 3 years, or they may recommend the Pap and pelvic exam, combined with testing for human papilloma virus (HPV), every 5 years. Some types of HPV increase your risk of cervical cancer. Testing for HPV may also be done on women of any age with unclear Pap test results.  Other health care providers may not recommend any screening for nonpregnant women who are considered low risk for pelvic cancer and who do not have symptoms. Ask your health care provider if a screening pelvic exam is right for you.  If you have had past treatment for cervical cancer or a condition that could lead to cancer, you need Pap tests and screening for cancer for at least 20 years after your treatment. If Pap tests have been discontinued, your risk factors (such as having a new sexual partner) need to be reassessed to determine if screening should resume. Some women have medical problems that increase the chance of getting cervical cancer. In these cases, your health care provider may recommend more frequent screening and Pap tests. Colorectal Cancer  This type of cancer can be detected and often prevented.  Routine colorectal cancer screening usually begins at 56 years of age and continues through 56 years of age.  Your health care provider may recommend screening at an earlier age if you have risk factors for colon cancer.  Your health care provider may also recommend using home test kits to check for hidden blood in the stool.  A small camera at the end of a tube can be used to examine your colon directly (sigmoidoscopy or colonoscopy). This is done to check for the earliest forms of colorectal  cancer.  Routine screening usually begins at age 50.  Direct examination of the colon should be repeated every 5-10 years through 56 years of age. However, you may need to be screened more often if early forms of precancerous polyps or small growths are found. Skin Cancer  Check your skin from head to toe regularly.  Tell your health care provider about any new moles or changes in moles, especially if there is a change in a mole's shape or color.  Also tell your health care provider if you have a mole that is larger than the size of a pencil eraser.  Always use sunscreen. Apply sunscreen liberally and repeatedly throughout the day.  Protect yourself by wearing long sleeves, pants, a wide-brimmed hat, and sunglasses whenever you are outside. HEART DISEASE, DIABETES, AND HIGH BLOOD PRESSURE   High blood pressure causes heart disease and increases the risk of stroke. High blood pressure is more likely to develop in:  People who have blood pressure in the high end   of the normal range (130-139/85-89 mm Hg).  People who are overweight or obese.  People who are African American.  If you are 38-23 years of age, have your blood pressure checked every 3-5 years. If you are 61 years of age or older, have your blood pressure checked every year. You should have your blood pressure measured twice--once when you are at a hospital or clinic, and once when you are not at a hospital or clinic. Record the average of the two measurements. To check your blood pressure when you are not at a hospital or clinic, you can use:  An automated blood pressure machine at a pharmacy.  A home blood pressure monitor.  If you are between 45 years and 39 years old, ask your health care provider if you should take aspirin to prevent strokes.  Have regular diabetes screenings. This involves taking a blood sample to check your fasting blood sugar level.  If you are at a normal weight and have a low risk for diabetes,  have this test once every three years after 56 years of age.  If you are overweight and have a high risk for diabetes, consider being tested at a younger age or more often. PREVENTING INFECTION  Hepatitis B  If you have a higher risk for hepatitis B, you should be screened for this virus. You are considered at high risk for hepatitis B if:  You were born in a country where hepatitis B is common. Ask your health care provider which countries are considered high risk.  Your parents were born in a high-risk country, and you have not been immunized against hepatitis B (hepatitis B vaccine).  You have HIV or AIDS.  You use needles to inject street drugs.  You live with someone who has hepatitis B.  You have had sex with someone who has hepatitis B.  You get hemodialysis treatment.  You take certain medicines for conditions, including cancer, organ transplantation, and autoimmune conditions. Hepatitis C  Blood testing is recommended for:  Everyone born from [birth year] through 1966.  Anyone with known risk factors for hepatitis C. Sexually transmitted infections (STIs)  You should be screened for sexually transmitted infections (STIs) including gonorrhea and chlamydia if:  You are sexually active and are younger than 56 years of age.  You are older than 56 years of age and your health care provider tells you that you are at risk for this type of infection.  Your sexual activity has changed since you were last screened and you are at an increased risk for chlamydia or gonorrhea. Ask your health care provider if you are at risk.  If you do not have HIV, but are at risk, it may be recommended that you take a prescription medicine daily to prevent HIV infection. This is called pre-exposure prophylaxis (PrEP). You are considered at risk if:  You are sexually active and do not regularly use condoms or know the HIV status of your partner(s).  You take drugs by injection.  You are sexually  active with a partner who has HIV. Talk with your health care provider about whether you are at high risk of being infected with HIV. If you choose to begin PrEP, you should first be tested for HIV. You should then be tested every 3 months for as long as you are taking PrEP.  PREGNANCY   If you are premenopausal and you may become pregnant, ask your health care provider about preconception counseling.  If you may  become pregnant, take 400 to 800 micrograms (mcg) of folic acid every day.  If you want to prevent pregnancy, talk to your health care provider about birth control (contraception). OSTEOPOROSIS AND MENOPAUSE   Osteoporosis is a disease in which the bones lose minerals and strength with aging. This can result in serious bone fractures. Your risk for osteoporosis can be identified using a bone density scan.  If you are 61 years of age or older, or if you are at risk for osteoporosis and fractures, ask your health care provider if you should be screened.  Ask your health care provider whether you should take a calcium or vitamin D supplement to lower your risk for osteoporosis.  Menopause may have certain physical symptoms and risks.  Hormone replacement therapy may reduce some of these symptoms and risks. Talk to your health care provider about whether hormone replacement therapy is right for you.  HOME CARE INSTRUCTIONS   Schedule regular health, dental, and eye exams.  Stay current with your immunizations.   Do not use any tobacco products including cigarettes, chewing tobacco, or electronic cigarettes.  If you are pregnant, do not drink alcohol.  If you are breastfeeding, limit how much and how often you drink alcohol.  Limit alcohol intake to no more than 1 drink per day for nonpregnant women. One drink equals 12 ounces of beer, 5 ounces of wine, or 1 ounces of hard liquor.  Do not use street drugs.  Do not share needles.  Ask your health care provider for help if  you need support or information about quitting drugs.  Tell your health care provider if you often feel depressed.  Tell your health care provider if you have ever been abused or do not feel safe at home.   This information is not intended to replace advice given to you by your health care provider. Make sure you discuss any questions you have with your health care provider.   Document Released: 04/02/2011 Document Revised: 10/08/2014 Document Reviewed: 08/19/2013 Elsevier Interactive Patient Education [employer].

## 2016-03-12 ENCOUNTER — Ambulatory Visit (INDEPENDENT_AMBULATORY_CARE_PROVIDER_SITE_OTHER): Payer: 59 | Admitting: Family Medicine

## 2016-03-12 ENCOUNTER — Encounter: Payer: Self-pay | Admitting: Family Medicine

## 2016-03-12 VITALS — BP 98/64 | HR 72 | Ht 66.5 in | Wt 175.0 lb

## 2016-03-12 DIAGNOSIS — S42433G Displaced fracture (avulsion) of lateral epicondyle of unspecified humerus, subsequent encounter for fracture with delayed healing: Secondary | ICD-10-CM | POA: Diagnosis not present

## 2016-03-12 NOTE — Progress Notes (Signed)
[facility] [address] [place], [place] [address] Phone: [phone] Subjective:    I'm seeing this patient by the request  of:  COPLAND,JESSICA, MD   CC: right elbow pain f/u  [id number] Latasha Bell is a 55 y.o. female coming in with complaint of right elbow pain. Patient was found to have an avulsion fracture with some mild intersubstance tearing of the lateral epicondylar region. Patient was worsening and was sent to formal physical therapy after 2 week hiatus from work. Patient is feeling much better. States that she is noticing increasing strength at physical therapy. States that the pain is less as well. Patient did have did discontinue the nitroglycerin secondary to headaches. Continues with the vitamin D. Happy with the results of far.    Past Medical History  Diagnosis Date  . Asthma   . Hypothyroid   . Hiatal hernia   . Thyroid nodule     solid  . S/P LEEP (loop electrosurgical excision procedure) 7/14    HGSIL  . Colon polyp   . Acid reflux   . Memory difficulties 05/02/2015  . Chronic insomnia 05/02/2015  . Tendonitis of elbow, right 2017  . Elevated hemoglobin A1c May 2017    5.9   Past Surgical History  Procedure Laterality Date  . Breast mass excision      2 breast bx's--fibrocystic changes  . Thyroidectomy, partial    . Hemorrhoid banding  03/2011    Dr. Kinnie Scales   Social History   Social History  . Marital Status: Divorced    Spouse Name: N/A  . Number of Children: 1  . Years of Education: MBA   Occupational History  . Lab [employer]    Social History Main Topics  . Smoking status: Former Smoker -- 0.50 packs/day for 15 years    Types: Cigarettes  . Smokeless tobacco: Never Used  . Alcohol Use: 1.8 oz/week    3 Standard drinks or equivalent per week     Comment: socially  . Drug Use: No  . Sexual Activity:    Partners: Male    Birth Control/ Protection: Post-menopausal   Other Topics Concern  . None   Social  History Narrative   Patient drinks 3-4 cups of caffeine daily.   Patient is right handed.   No Known Allergies Family History  Problem Relation Age of Onset  . Breast cancer Mother 42  . Colon cancer Father 44  . Cancer Brother     throat    Past medical history, social, surgical and family history all reviewed in electronic medical record.  No pertanent information unless stated regarding to the chief complaint.   Review of Systems: No headache, visual changes, nausea, vomiting, diarrhea, constipation, dizziness, abdominal pain, skin rash, fevers, chills, night sweats, weight loss, swollen lymph nodes, body aches, joint swelling, muscle aches, chest pain, shortness of breath, mood changes.   Objective Blood pressure 98/64, pulse 72, height 5' 6.5" (1.689 m), weight 175 lb (79.379 kg), last menstrual period 10/01/2008, SpO2 97 %.  General: No apparent distress alert and oriented x3 mood and affect normal, dressed appropriately.  HEENT: Pupils equal, extraocular movements intact  Respiratory: Patient's speak in full sentences and does not appear short of breath  Cardiovascular: No lower extremity edema, non tender, no erythema  Skin: Warm dry intact with no signs of infection or rash on extremities or on axial skeleton.  Abdomen: Soft nontender  Neuro: Cranial nerves II through XII  are intact, neurovascularly intact in all extremities with 2+ DTRs and 2+ pulses.  Lymph: No lymphadenopathy of posterior or anterior cervical chain or axillae bilaterally.  Gait normal with good balance and coordination.  MSK:  Non tender with full range of motion and good stability and symmetric strength and tone of shoulders,  wrist, hip, knee and ankles bilaterally.  Elbow:Right No swelling noted today Full range of motion with full strength. Contralateral side Stable to varus, valgus stress. Negative moving valgus stress test. Nontender on exam Ulnar nerve does not sublux. Negative cubital tunnel  Tinel's. Contralateral elbow unremarkable  .         Impression and Recommendations:     This case required medical decision making of moderate complexity.      Note: This dictation was prepared with Dragon dictation along with smaller phrase technology. Any transcriptional errors that result from this process are unintentional.

## 2016-03-12 NOTE — Patient Instructions (Signed)
Good to see you  I am impressed Continue with physical therapy at this time.  Finish the vitamin D and then go to 2000IU daily  ICe when you need it Stay off the nitro for now See me again in 4 weeks if not 90% better at least!

## 2016-03-12 NOTE — Assessment & Plan Note (Signed)
Patient was showing interval healing at last exam and is doing much better since then. Patient has discontinue the nitroglycerin. Will finish up with the once weekly vitamin D over the course of the next 2 weeks. Patient is artery return to work and I think she will do very well. Patient will follow-up with me again in 4-6 weeks if pain is not completely resolved.

## 2016-03-12 NOTE — Progress Notes (Signed)
Pre visit review using our clinic review tool, if applicable. No additional management support is needed unless otherwise documented below in the visit note. 

## 2016-03-13 LAB — IPS PAP TEST WITH HPV

## 2016-03-14 ENCOUNTER — Telehealth: Payer: Self-pay

## 2016-03-14 NOTE — Telephone Encounter (Signed)
-----   Message from Patton SallesBrook E Amundson C Silva, MD sent at 03/13/2016  7:43 PM EDT ----- Results to patient through My Chart. Pap and HR HPV negative.  Recall - 02.   "Hello Ms. Durbin,   I am sharing good news with you!  Your pap smear is normal, and the high risk HPV test is negative.   I hope you have a really good year.    Call if you have any questions or need anything.  Conley SimmondsBrook Silva, MD"

## 2016-03-14 NOTE — Telephone Encounter (Signed)
Called patient at [phone] and left message for patient to call me--she has not reviewed her MyChart message regarding her pap/HR HPV testing.  AEX 03-20-17.

## 2016-03-15 ENCOUNTER — Ambulatory Visit: Payer: 59 | Admitting: Adult Health

## 2016-03-16 ENCOUNTER — Ambulatory Visit: Payer: 59 | Admitting: Family Medicine

## 2016-03-16 NOTE — Telephone Encounter (Signed)
Spoke with patient and notified of pap results normal and negative HR HPV.  AEX 03-20-17.

## 2016-04-04 ENCOUNTER — Other Ambulatory Visit: Payer: Self-pay | Admitting: Family Medicine

## 2016-04-04 NOTE — Telephone Encounter (Signed)
Per last OV, pt is no longer taking nitro patches. Refill denied.

## 2016-04-06 ENCOUNTER — Ambulatory Visit: Payer: 59 | Admitting: Family Medicine

## 2016-04-27 ENCOUNTER — Ambulatory Visit: Payer: 59 | Admitting: Family Medicine

## 2016-05-08 ENCOUNTER — Telehealth: Payer: Self-pay | Admitting: Family Medicine

## 2016-05-08 NOTE — Telephone Encounter (Signed)
States Physical Therapist and Hand Specialist and sent orders over to add an additional PT for dry needling.  Requesting office to call.  Please follow up with patient in regard.

## 2016-05-09 NOTE — Telephone Encounter (Signed)
lmovm for pt to return call.  

## 2016-05-09 NOTE — Telephone Encounter (Signed)
Spoke to pt, advised her I have not received an order from PT. Gave her fax # & she stated she will have them resend it.

## 2016-06-08 ENCOUNTER — Other Ambulatory Visit: Payer: Self-pay | Admitting: Obstetrics & Gynecology

## 2016-06-08 ENCOUNTER — Ambulatory Visit
Admission: RE | Admit: 2016-06-08 | Discharge: 2016-06-08 | Disposition: A | Discharge status: Home / Self Care | Payer: 59 | Source: Ambulatory Visit

## 2016-06-08 DIAGNOSIS — Z1231 Encounter for screening mammogram for malignant neoplasm of breast: Secondary | ICD-10-CM

## 2016-06-22 ENCOUNTER — Ambulatory Visit: Payer: 59 | Admitting: Obstetrics and Gynecology

## 2017-01-28 ENCOUNTER — Other Ambulatory Visit: Payer: Self-pay

## 2017-01-28 ENCOUNTER — Telehealth: Payer: Self-pay | Admitting: Family Medicine

## 2017-01-28 ENCOUNTER — Ambulatory Visit (INDEPENDENT_AMBULATORY_CARE_PROVIDER_SITE_OTHER): Payer: Managed Care, Other (non HMO) | Admitting: Family Medicine

## 2017-01-28 ENCOUNTER — Encounter: Payer: Self-pay | Admitting: Family Medicine

## 2017-01-28 VITALS — BP 110/68 | HR 68 | Temp 98.6°F | Ht 66.5 in | Wt 171.0 lb

## 2017-01-28 DIAGNOSIS — E890 Postprocedural hypothyroidism: Secondary | ICD-10-CM

## 2017-01-28 DIAGNOSIS — Z13220 Encounter for screening for lipoid disorders: Secondary | ICD-10-CM | POA: Diagnosis not present

## 2017-01-28 DIAGNOSIS — Z130 Encounter for screening for diseases of the blood and blood-forming organs and certain disorders involving the immune mechanism: Secondary | ICD-10-CM

## 2017-01-28 DIAGNOSIS — D72819 Decreased white blood cell count, unspecified: Secondary | ICD-10-CM | POA: Diagnosis not present

## 2017-01-28 DIAGNOSIS — Z9189 Other specified personal risk factors, not elsewhere classified: Secondary | ICD-10-CM | POA: Diagnosis not present

## 2017-01-28 DIAGNOSIS — Z0000 Encounter for general adult medical examination without abnormal findings: Secondary | ICD-10-CM

## 2017-01-28 DIAGNOSIS — Z8639 Personal history of other endocrine, nutritional and metabolic disease: Secondary | ICD-10-CM

## 2017-01-28 LAB — COMPREHENSIVE METABOLIC PANEL
ALBUMIN: 4.2 g/dL (ref 3.5–5.2)
ALT: 19 U/L (ref 0–35)
AST: 25 U/L (ref 0–37)
Alkaline Phosphatase: 67 U/L (ref 39–117)
BUN: 13 mg/dL (ref 6–23)
CHLORIDE: 105 meq/L (ref 96–112)
CO2: 27 meq/L (ref 19–32)
CREATININE: 0.85 mg/dL (ref 0.40–1.20)
Calcium: 9.2 mg/dL (ref 8.4–10.5)
GFR: 73.31 mL/min (ref >60.00)
GLUCOSE: 98 mg/dL (ref 70–99)
Potassium: 3.9 mEq/L (ref 3.5–5.1)
SODIUM: 139 meq/L (ref 135–145)
Total Bilirubin: 0.5 mg/dL (ref 0.2–1.2)
Total Protein: 6.7 g/dL (ref 6.0–8.3)

## 2017-01-28 LAB — VITAMIN D 25 HYDROXY (VIT D DEFICIENCY, FRACTURES): VITD: 28.92 ng/mL — ABNORMAL LOW (ref 30.00–100.00)

## 2017-01-28 LAB — LIPID PANEL
CHOLESTEROL: 220 mg/dL — AB (ref 0–200)
HDL: 70.9 mg/dL (ref >39.00)
LDL Cholesterol: 137 mg/dL — ABNORMAL HIGH (ref 0–99)
NonHDL: 148.83
TRIGLYCERIDES: 61 mg/dL (ref 0.0–149.0)
Total CHOL/HDL Ratio: 3
VLDL: 12.2 mg/dL (ref 0.0–40.0)

## 2017-01-28 LAB — HEMOGLOBIN A1C: Hgb A1c MFr Bld: 5.7 % (ref 4.6–6.5)

## 2017-01-28 LAB — CBC
HCT: 39.2 % (ref 36.0–46.0)
Hemoglobin: 13.1 g/dL (ref 12.0–15.0)
MCHC: 33.5 g/dL (ref 30.0–36.0)
MCV: 89 fl (ref 78.0–100.0)
Platelets: 186 10*3/uL (ref 150.0–400.0)
RBC: 4.4 Mil/uL (ref 3.87–5.11)
RDW: 13.3 % (ref 11.5–15.5)
WBC: 3.3 10*3/uL — ABNORMAL LOW (ref 4.0–10.5)

## 2017-01-28 LAB — TSH: TSH: 1.34 u[IU]/mL (ref 0.35–4.50)

## 2017-01-28 MED ORDER — ALBUTEROL SULFATE HFA 108 (90 BASE) MCG/ACT IN AERS: 2.0000 | INHALATION_SPRAY | Freq: Four times a day (QID) | RESPIRATORY_TRACT | 0 refills | Status: DC | PRN

## 2017-01-28 MED ORDER — LEVOTHYROXINE SODIUM 100 MCG PO TABS: 100.0000 ug | ORAL_TABLET | Freq: Every day | ORAL | 3 refills | Status: DC

## 2017-01-28 NOTE — Progress Notes (Signed)
Pre visit review using our clinic tool,if applicable. No additional management support is needed unless otherwise documented below in the visit note.  

## 2017-01-28 NOTE — Patient Instructions (Signed)
It was a pleasure to see you today- take care and I will be in touch with your labs asap We will refill your thyroid medication pending your TSH level  Continue your excellent efforts with diet and exercise- congrats on your progress so far!    Health Maintenance for Postmenopausal Women Menopause is a normal process in which your reproductive ability comes to an end. This process happens gradually over a span of months to years, usually between the ages of 34 and 12. Menopause is complete when you have missed 12 consecutive menstrual periods. It is important to talk with your health care provider about some of the most common conditions that affect postmenopausal women, such as heart disease, cancer, and bone loss (osteoporosis). Adopting a healthy lifestyle and getting preventive care can help to promote your health and wellness. Those actions can also lower your chances of developing some of these common conditions. What should I know about menopause? During menopause, you may experience a number of symptoms, such as:  Moderate-to-severe hot flashes.  Night sweats.  Decrease in sex drive.  Mood swings.  Headaches.  Tiredness.  Irritability.  Memory problems.  Insomnia. Choosing to treat or not to treat menopausal changes is an individual decision that you make with your health care provider. What should I know about hormone replacement therapy and supplements? Hormone therapy products are effective for treating symptoms that are associated with menopause, such as hot flashes and night sweats. Hormone replacement carries certain risks, especially as you become older. If you are thinking about using estrogen or estrogen with progestin treatments, discuss the benefits and risks with your health care provider. What should I know about heart disease and stroke? Heart disease, heart attack, and stroke become more likely as you age. This may be due, in part, to the hormonal changes that  your body experiences during menopause. These can affect how your body processes dietary fats, triglycerides, and cholesterol. Heart attack and stroke are both medical emergencies. There are many things that you can do to help prevent heart disease and stroke:  Have your blood pressure checked at least every 1-2 years. High blood pressure causes heart disease and increases the risk of stroke.  If you are 71-20 years old, ask your health care provider if you should take aspirin to prevent a heart attack or a stroke.  Do not use any tobacco products, including cigarettes, chewing tobacco, or electronic cigarettes. If you need help quitting, ask your health care provider.  It is important to eat a healthy diet and maintain a healthy weight.  Be sure to include plenty of vegetables, fruits, low-fat dairy products, and lean protein.  Avoid eating foods that are high in solid fats, added sugars, or salt (sodium).  Get regular exercise. This is one of the most important things that you can do for your health.  Try to exercise for at least 150 minutes each week. The type of exercise that you do should increase your heart rate and make you sweat. This is known as moderate-intensity exercise.  Try to do strengthening exercises at least twice each week. Do these in addition to the moderate-intensity exercise.  Know your numbers.Ask your health care provider to check your cholesterol and your blood glucose. Continue to have your blood tested as directed by your health care provider. What should I know about cancer screening? There are several types of cancer. Take the following steps to reduce your risk and to catch any cancer development as  early as possible. Breast Cancer  Practice breast self-awareness.  This means understanding how your breasts normally appear and feel.  It also means doing regular breast self-exams. Let your health care provider know about any changes, no matter how  small.  If you are 80 or older, have a clinician do a breast exam (clinical breast exam or CBE) every year. Depending on your age, family history, and medical history, it may be recommended that you also have a yearly breast X-ray (mammogram).  If you have a family history of breast cancer, talk with your health care provider about genetic screening.  If you are at high risk for breast cancer, talk with your health care provider about having an MRI and a mammogram every year.  Breast cancer (BRCA) gene test is recommended for women who have family members with BRCA-related cancers. Results of the assessment will determine the need for genetic counseling and BRCA1 and for BRCA2 testing. BRCA-related cancers include these types:  Breast. This occurs in males or females.  Ovarian.  Tubal. This may also be called fallopian tube cancer.  Cancer of the abdominal or pelvic lining (peritoneal cancer).  Prostate.  Pancreatic. Cervical, Uterine, and Ovarian Cancer  Your health care provider may recommend that you be screened regularly for cancer of the pelvic organs. These include your ovaries, uterus, and vagina. This screening involves a pelvic exam, which includes checking for microscopic changes to the surface of your cervix (Pap test).  For women ages 21-65, health care providers may recommend a pelvic exam and a Pap test every three years. For women ages 60-65, they may recommend the Pap test and pelvic exam, combined with testing for human papilloma virus (HPV), every five years. Some types of HPV increase your risk of cervical cancer. Testing for HPV may also be done on women of any age who have unclear Pap test results.  Other health care providers may not recommend any screening for nonpregnant women who are considered low risk for pelvic cancer and have no symptoms. Ask your health care provider if a screening pelvic exam is right for you.  If you have had past treatment for cervical  cancer or a condition that could lead to cancer, you need Pap tests and screening for cancer for at least 20 years after your treatment. If Pap tests have been discontinued for you, your risk factors (such as having a new sexual partner) need to be reassessed to determine if you should start having screenings again. Some women have medical problems that increase the chance of getting cervical cancer. In these cases, your health care provider may recommend that you have screening and Pap tests more often.  If you have a family history of uterine cancer or ovarian cancer, talk with your health care provider about genetic screening.  If you have vaginal bleeding after reaching menopause, tell your health care provider.  There are currently no reliable tests available to screen for ovarian cancer. Lung Cancer  Lung cancer screening is recommended for adults 40-45 years old who are at high risk for lung cancer because of a history of smoking. A yearly low-dose CT scan of the lungs is recommended if you:  Currently smoke.  Have a history of at least 30 pack-years of smoking and you currently smoke or have quit within the past 15 years. A pack-year is smoking an average of one pack of cigarettes per day for one year. Yearly screening should:  Continue until it has been 15  years since you quit.  Stop if you develop a health problem that would prevent you from having lung cancer treatment. Colorectal Cancer  This type of cancer can be detected and can often be prevented.  Routine colorectal cancer screening usually begins at age 47 and continues through age 91.  If you have risk factors for colon cancer, your health care provider may recommend that you be screened at an earlier age.  If you have a family history of colorectal cancer, talk with your health care provider about genetic screening.  Your health care provider may also recommend using home test kits to check for hidden blood in your  stool.  A small camera at the end of a tube can be used to examine your colon directly (sigmoidoscopy or colonoscopy). This is done to check for the earliest forms of colorectal cancer.  Direct examination of the colon should be repeated every 5-10 years until age 21. However, if early forms of precancerous polyps or small growths are found or if you have a family history or genetic risk for colorectal cancer, you may need to be screened more often. Skin Cancer  Check your skin from head to toe regularly.  Monitor any moles. Be sure to tell your health care provider:  About any new moles or changes in moles, especially if there is a change in a mole's shape or color.  If you have a mole that is larger than the size of a pencil eraser.  If any of your family members has a history of skin cancer, especially at a young age, talk with your health care provider about genetic screening.  Always use sunscreen. Apply sunscreen liberally and repeatedly throughout the day.  Whenever you are outside, protect yourself by wearing long sleeves, pants, a wide-brimmed hat, and sunglasses. What should I know about osteoporosis? Osteoporosis is a condition in which bone destruction happens more quickly than new bone creation. After menopause, you may be at an increased risk for osteoporosis. To help prevent osteoporosis or the bone fractures that can happen because of osteoporosis, the following is recommended:  If you are 78-38 years old, get at least 1,000 mg of calcium and at least 600 mg of vitamin D per day.  If you are older than age 59 but younger than age 67, get at least 1,200 mg of calcium and at least 600 mg of vitamin D per day.  If you are older than age 58, get at least 1,200 mg of calcium and at least 800 mg of vitamin D per day. Smoking and excessive alcohol intake increase the risk of osteoporosis. Eat foods that are rich in calcium and vitamin D, and do weight-bearing exercises several  times each week as directed by your health care provider. What should I know about how menopause affects my mental health? Depression may occur at any age, but it is more common as you become older. Common symptoms of depression include:  Low or sad mood.  Changes in sleep patterns.  Changes in appetite or eating patterns.  Feeling an overall lack of motivation or enjoyment of activities that you previously enjoyed.  Frequent crying spells. Talk with your health care provider if you think that you are experiencing depression. What should I know about immunizations? It is important that you get and maintain your immunizations. These include:  Tetanus, diphtheria, and pertussis (Tdap) booster vaccine.  Influenza every year before the flu season begins.  Pneumonia vaccine.  Shingles vaccine. Your health  care provider may also recommend other immunizations. This information is not intended to replace advice given to you by your health care provider. Make sure you discuss any questions you have with your health care provider. Document Released: 11/09/2005 Document Revised: 04/06/2016 Document Reviewed: 06/21/2015 Elsevier Interactive Patient Education  2017 [employer].

## 2017-01-28 NOTE — Progress Notes (Addendum)
[facility] [address] [place], [place] [address] [phone]  Date:  01/28/2017   Name:  Latasha Bell   DOB:  Aug 15, 1961   MRN:  [id number]  PCP:  Abbe Amsterdam, MD    Chief Complaint: Annual Exam [facility] Inhaler Ordered )   History of Present Illness:  Latasha Bell is a 56 y.o. very pleasant female patient who presents with the following:  Here today for a CPE History of hypothyroidism, GERD, pre-diabetes, insomnia. Last seen by myself about a year ago to discuss her thyroid medication Lab Results  Component Value Date   TSH 1.32 02/20/2016   She sees Dr. Edward Jolly for her GYN care- her next appt with her is in June She did have a fracture of her right elbow since our last visit but it has healed ok- she has normal ROM and no pain as of now  She is taking her trazodone just as needed for sleep right now; not on any other chronic meds She is not taking wellbutrin any longer- she feels like she no longer needs this She takes albuterol as needed on rare occasion  Wt Readings from Last 3 Encounters:  01/28/17 171 lb (77.6 kg)  03/12/16 175 lb (79.4 kg)  03/09/16 175 lb 12.8 oz (79.7 kg)   She had gotten up to 187 lbs max; she is exercising by doing step class 2-3 x a week.  She has also changed her diet She does not drink much alcohol- she has to watch her calories She isa former smoker  She did a colonoscopy 3 years ago- she has q 5 year screening. Her dad had colon cancer. Her GI doctor is [facility], Dr. Randa Evens   Patient Active Problem List   Diagnosis Date Noted  . Pre-diabetes 02/22/2016  . Postoperative hypothyroidism 02/20/2016  . Gastroesophageal reflux disease without esophagitis 02/20/2016  . Displaced avulsion fracture of lateral epicondyle of humerus with delayed healing 01/17/2016  . Memory difficulties 05/02/2015  . Chronic insomnia 05/02/2015  . S/P LEEP 03/29/2014  . AGUS  favor dysplasia 02/24/2013  . Unspecified hypothyroidism 02/24/2013    Past Medical History:  Diagnosis Date  . Acid reflux   . Asthma   . Chronic insomnia 05/02/2015  . Colon polyp   . Elevated hemoglobin A1c May 2017   5.9  . Hiatal hernia   . Hypothyroid   . Memory difficulties 05/02/2015  . S/P LEEP (loop electrosurgical excision procedure) 7/14   HGSIL  . Tendonitis of elbow, right 2017  . Thyroid nodule    solid    Past Surgical History:  Procedure Laterality Date  . BREAST MASS EXCISION     2 breast bx's--fibrocystic changes  . HEMORRHOID BANDING  03/2011   Dr. Kinnie Scales  . THYROIDECTOMY, PARTIAL      Social History  Substance Use Topics  . Smoking status: Former Smoker    Packs/day: 0.50    Years: 15.00    Types: Cigarettes  . Smokeless tobacco: Never Used  . Alcohol use 1.8 oz/week    3 Standard drinks or equivalent per week     Comment: socially    Family History  Problem Relation Age of Onset  . Breast cancer Mother 41  . Colon cancer Father 42  . Cancer Brother     throat    No Known Allergies  Medication list has been reviewed and updated.  Current Outpatient Prescriptions on  File Prior to Visit  Medication Sig Dispense Refill  . levothyroxine (SYNTHROID, LEVOTHROID) 100 MCG tablet Take 1 tablet (100 mcg total) by mouth daily before breakfast. 1 tablet Monday-Saturday and 1/2 tablet Sunday 90 tablet 3  . traZODone (DESYREL) 50 MG tablet Take 1 or 1.5 tablets nightly for insomnia 145 tablet 3  . buPROPion (WELLBUTRIN SR) 150 MG 12 hr tablet Take 1 tablet (150 mg total) by mouth 2 (two) times daily. (Patient not taking: Reported on 01/28/2017) 180 tablet 3  . Multiple Vitamin (MULTIVITAMIN) capsule Take 1 capsule by mouth daily.    . nitroGLYCERIN (NITRODUR - DOSED IN MG/24 HR) 0.2 mg/hr patch 1/4 patch daily (Patient not taking: Reported on 01/28/2017) 30 patch 1  . omeprazole (PRILOSEC) 40 MG capsule Take 1 capsule (40 mg total) by mouth daily.  (Patient not taking: Reported on 01/28/2017) 90 capsule 3  . Vitamin D, Ergocalciferol, (DRISDOL) 50000 units CAPS capsule Take 1 capsule (50,000 Units total) by mouth every 7 (seven) days. (Patient not taking: Reported on 01/28/2017) 12 capsule 1   No current facility-administered medications on file prior to visit.     Review of Systems:  As per HPI- otherwise negative.   Physical Examination: Vitals:   01/28/17 0828  BP: 110/68  Pulse: 68  Temp: 98.6 F (37 C)   Vitals:   01/28/17 0828  Weight: 171 lb (77.6 kg)  Height: 5' 6.5" (1.689 m)   Body mass index is 27.19 kg/m. Ideal Body Weight: Weight in (lb) to have BMI = 25: 156.9  GEN: WDWN, NAD, Non-toxic, A & O x 3, mild overweight, looks well HEENT: Atraumatic, Normocephalic. Neck supple. No masses, No LAD.  Bilateral TM wnl, oropharynx normal.  PEERL,EOMI.  Ears and Nose: No external deformity. CV: RRR, No M/G/R. No JVD. No thrill. No extra heart sounds. PULM: CTA B, no wheezes, crackles, rhonchi. No retractions. No resp. distress. No accessory muscle use. ABD: S, NT, ND. No rebound. No HSM. EXTR: No c/c/e NEURO Normal gait.  PSYCH: Normally interactive. Conversant. Not depressed or anxious appearing.  Calm demeanor.    Assessment and Plan: Physical exam  Screening for deficiency anemia - Plan: CBC  History of hyperglycemia - Plan: Comprehensive metabolic panel, Hemoglobin A1c  Screening for hyperlipidemia - Plan: Lipid panel  At risk for wheezing - Plan: albuterol (PROVENTIL HFA;VENTOLIN HFA) 108 (90 Base) MCG/ACT inhaler  Postoperative hypothyroidism - Plan: TSH  History of vitamin D deficiency - Plan: Vitamin D (25 hydroxy)  Here today for a CPE Doing very well overall- her family is well, she is exercising and has changed her diet, has lost some weight Will check labs today- wait to fill her thyroid med pending her TSH level  Will plan further follow- up pending labs. See patient instructions for more  details.     Signed Abbe Amsterdam, MD  Received her labs   Results for orders placed or performed in visit on 01/28/17  CBC  Result Value Ref Range   WBC 3.3 (L) 4.0 - 10.5 K/uL   RBC 4.40 3.87 - 5.11 Mil/uL   Platelets 186.0 150.0 - 400.0 K/uL   Hemoglobin 13.1 12.0 - 15.0 g/dL   HCT 40.9 81.1 - 91.4 %   MCV 89.0 78.0 - 100.0 fl   MCHC 33.5 30.0 - 36.0 g/dL   RDW 78.2 95.6 - 21.3 %  Comprehensive metabolic panel  Result Value Ref Range   Sodium 139 135 - 145 mEq/L   Potassium 3.9  3.5 - 5.1 mEq/L   Chloride 105 96 - 112 mEq/L   CO2 27 19 - 32 mEq/L   Glucose, Bld 98 70 - 99 mg/dL   BUN 13 6 - 23 mg/dL   Creatinine, Ser 1.61 0.40 - 1.20 mg/dL   Total Bilirubin 0.5 0.2 - 1.2 mg/dL   Alkaline Phosphatase 67 39 - 117 U/L   AST 25 0 - 37 U/L   ALT 19 0 - 35 U/L   Total Protein 6.7 6.0 - 8.3 g/dL   Albumin 4.2 3.5 - 5.2 g/dL   Calcium 9.2 8.4 - 09.6 mg/dL   GFR 04.54 >09.81 mL/min  TSH  Result Value Ref Range   TSH 1.34 0.35 - 4.50 uIU/mL  Hemoglobin A1c  Result Value Ref Range   Hgb A1c MFr Bld 5.7 4.6 - 6.5 %  Lipid panel  Result Value Ref Range   Cholesterol 220 (H) 0 - 200 mg/dL   Triglycerides 19.1 0.0 - 149.0 mg/dL   HDL 47.82 >95.62 mg/dL   VLDL 13.0 0.0 - 86.5 mg/dL   LDL Cholesterol 784 (H) 0 - 99 mg/dL   Total CHOL/HDL Ratio 3    NonHDL 148.83   Vitamin D (25 hydroxy)  Result Value Ref Range   VITD 28.92 (L) 30.00 - 100.00 ng/mL   mychart message to pt

## 2017-01-28 NOTE — Addendum Note (Signed)
Addended by: Abbe Amsterdam C on: 01/28/2017 07:36 PM   Modules accepted: Orders

## 2017-01-28 NOTE — Telephone Encounter (Signed)
Pease re-send patients Albuterol Rx to  [facility] [place], [place] - [address] [phone] (Phone) [phone] (Fax)

## 2017-01-28 NOTE — Telephone Encounter (Signed)
Medication sent per patient request. (Albuterol)

## 2017-02-01 ENCOUNTER — Telehealth: Payer: Self-pay | Admitting: Family Medicine

## 2017-02-01 ENCOUNTER — Encounter: Payer: Self-pay | Admitting: Family Medicine

## 2017-02-01 DIAGNOSIS — E890 Postprocedural hypothyroidism: Secondary | ICD-10-CM

## 2017-02-01 MED ORDER — LEVOTHYROXINE SODIUM 100 MCG PO TABS: 100.0000 ug | ORAL_TABLET | Freq: Every day | ORAL | 3 refills | Status: DC

## 2017-02-01 MED ORDER — LEVOTHYROXINE SODIUM 100 MCG PO TABS: 100.0000 ug | ORAL_TABLET | Freq: Every day | ORAL | 0 refills | Status: DC

## 2017-02-01 MED ORDER — ALBUTEROL SULFATE HFA 108 (90 BASE) MCG/ACT IN AERS: 1.0000 | INHALATION_SPRAY | Freq: Four times a day (QID) | RESPIRATORY_TRACT | 6 refills | Status: AC | PRN

## 2017-02-01 NOTE — Telephone Encounter (Signed)
Caller name: Relationship to patient: Self  Can be reached: [phone] Pharmacy:  [facility] [place] - [phone] [phone] (Phone) [phone] (Fax)     Reason for call: Patient request call back to discuss medications that were sent to pharmacy on 4/30.

## 2017-02-01 NOTE — Telephone Encounter (Signed)
Spoke with pt. Informed her that [facility]. Message sent to provider. Pt states that " Take 1 tablet Mon-Sat and 1/2 on Sun was something from her old provider and that she has always just taken 1 tablet daily.   Pt also states that her insurance does not covered albuterol and would like to know if provider could write her a script for ProAir instead.   Please advise.

## 2017-02-22 ENCOUNTER — Other Ambulatory Visit (HOSPITAL_COMMUNITY)
Admission: RE | Admit: 2017-02-22 | Discharge: 2017-02-22 | Disposition: A | Discharge status: Home / Self Care | Payer: Managed Care, Other (non HMO) | Source: Ambulatory Visit | Attending: Obstetrics and Gynecology | Admitting: Obstetrics and Gynecology

## 2017-02-22 ENCOUNTER — Ambulatory Visit (INDEPENDENT_AMBULATORY_CARE_PROVIDER_SITE_OTHER): Payer: Managed Care, Other (non HMO) | Admitting: Obstetrics and Gynecology

## 2017-02-22 ENCOUNTER — Encounter: Payer: Self-pay | Admitting: Obstetrics and Gynecology

## 2017-02-22 VITALS — BP 100/62 | HR 72 | Resp 16 | Ht 66.5 in | Wt 172.0 lb

## 2017-02-22 DIAGNOSIS — Z01419 Encounter for gynecological examination (general) (routine) without abnormal findings: Secondary | ICD-10-CM | POA: Insufficient documentation

## 2017-02-22 NOTE — Patient Instructions (Signed)

## 2017-02-22 NOTE — Progress Notes (Signed)
56 y.o. G2P1 Divorced Caucasian female here for annual exam.    Slightly elevated cholesterol and HgbA1C is 5.7.  Wants a pap today.  Hx LEEP.  No vaginal spotting or bleeding.  PCP:  Dr. Warner Mccreedy    Patient's last menstrual period was 10/01/2008.           Sexually active: Yes.    The current method of family planning is post menopausal status.    Exercising: Yes.    aerobics Smoker:  Former  Health Maintenance: Pap:  03/09/16 Pap and HR HPV Negative  06-10-15 Neg:Neg HR HPV History of abnormal Pap:  Yes,04/09/13 LEEP: LGSIL, margins negative. Prior history to this: Pap 02/27/13 - AGUS and ASCUS-H and positive HR HPV. Colpo 03/23/13 - bx HGSIL, ECC negative, EMB negative endometrium and a strip of LGSIL.  MMG:  06/08/16 BIRADS 1 negative/density b Colonoscopy:  03/2014 normal with Dr. Neill Loft due 03/2019 due to hx of polyps BMD:   04/01/12  Result  Normal: TBC TDaP:  01/2016 Gardasil:   N/A HIV: 2016 Negative Hep C: 2017 Negative Screening Labs:  Hb today: PCP takes care of all labs   reports that she has quit smoking. Her smoking use included Cigarettes. She has a 7.50 pack-year smoking history. She has never used smokeless tobacco. She reports that she drinks about 1.8 oz of alcohol per week . She reports that she does not use drugs.  Past Medical History:  Diagnosis Date  . Acid reflux   . Asthma   . Chronic insomnia 05/02/2015  . Colon polyp   . Elevated hemoglobin A1c May 2017   5.9  . Hiatal hernia   . Hypothyroid   . Memory difficulties 05/02/2015  . S/P LEEP (loop electrosurgical excision procedure) 7/14   HGSIL  . Tendonitis of elbow, right 2017  . Thyroid nodule    solid    Past Surgical History:  Procedure Laterality Date  . BREAST MASS EXCISION     2 breast bx's--fibrocystic changes  . HEMORRHOID BANDING  03/2011   Dr. Kinnie Scales  . THYROIDECTOMY, PARTIAL      Current Outpatient Prescriptions  Medication Sig Dispense Refill  . albuterol (PROAIR  HFA) 108 (90 Base) MCG/ACT inhaler Inhale 1-2 puffs into the lungs every 6 (six) hours as needed for wheezing or shortness of breath. 1 Inhaler 6  . albuterol (PROVENTIL HFA;VENTOLIN HFA) 108 (90 Base) MCG/ACT inhaler Inhale 2 puffs into the lungs every 6 (six) hours as needed for wheezing or shortness of breath. 1 Inhaler 0  . levothyroxine (SYNTHROID, LEVOTHROID) 100 MCG tablet Take 1 tablet (100 mcg total) by mouth daily before breakfast. 30 tablet 0  . Multiple Vitamin (MULTIVITAMIN) capsule Take 1 capsule by mouth daily.    . traZODone (DESYREL) 50 MG tablet Take 1 or 1.5 tablets nightly for insomnia 145 tablet 3   No current facility-administered medications for this visit.     Family History  Problem Relation Age of Onset  . Breast cancer Mother 69  . Colon cancer Father 27  . Cancer Brother        throat    ROS:  Pertinent items are noted in HPI.  Otherwise, a comprehensive ROS was negative.  Exam:   BP 100/62 (BP Location: Right Arm, Patient Position: Sitting, Cuff Size: Normal)   Pulse 72   Resp 16   Ht 5' 6.5" (1.689 m)   Wt 172 lb (78 kg)   LMP 10/01/2008   BMI 27.35 kg/m  General appearance: alert, cooperative and appears stated age Head: Normocephalic, without obvious abnormality, atraumatic Neck: no adenopathy, supple, symmetrical, trachea midline and thyroid normal to inspection and palpation Lungs: clear to auscultation bilaterally Breasts: normal appearance, no masses or tenderness, No nipple retraction or dimpling, No nipple discharge or bleeding, No axillary or supraclavicular adenopathy Heart: regular rate and rhythm Abdomen: soft, non-tender; no masses, no organomegaly Extremities: extremities normal, atraumatic, no cyanosis or edema Skin: Skin color, texture, turgor normal. No rashes or lesions Lymph nodes: Cervical, supraclavicular, and axillary nodes normal. No abnormal inguinal nodes palpated Neurologic: Grossly normal  Pelvic: External genitalia:   no lesions              Urethra:  normal appearing urethra with no masses, tenderness or lesions              Bartholins and Skenes: normal                 Vagina: normal appearing vagina with normal color and discharge, no lesions              Cervix: no lesions              Pap taken: Yes.   Bimanual Exam:  Uterus:  normal size, contour, position, consistency, mobility, non-tender              Adnexa: no mass, fullness, tenderness              Rectal exam: Yes.  .  Confirms.              Anus:  normal sphincter tone, no lesions.  External hemorrhoids.  Chaperone was present for exam.  Assessment:   Well woman visit with normal exam. Hx LEEP with HGSIL. Hx prior AGUS pap.  FH breast cancer in mother. Hemorrhoids.   Plan: Mammogram screening discussed.  Discussed 3D. Recommended self breast awareness. Pap and HR HPV as above. Guidelines for Calcium, Vitamin D, regular exercise program including cardiovascular and weight bearing exercise. Colace, high fiber diet for hemorrhoids.   Return to Dr. Kinnie ScalesMedoff prn. Follow up annually and prn.    After visit summary provided.

## 2017-02-26 LAB — CYTOLOGY - PAP
Diagnosis: NEGATIVE
HPV: NOT DETECTED

## 2017-03-01 ENCOUNTER — Other Ambulatory Visit: Payer: Self-pay | Admitting: Family Medicine

## 2017-03-01 DIAGNOSIS — E890 Postprocedural hypothyroidism: Secondary | ICD-10-CM

## 2017-03-18 ENCOUNTER — Ambulatory Visit: Payer: 59 | Admitting: Obstetrics and Gynecology

## 2017-03-20 ENCOUNTER — Ambulatory Visit: Payer: 59 | Admitting: Obstetrics and Gynecology

## 2017-03-27 ENCOUNTER — Encounter: Payer: Self-pay | Admitting: Family Medicine

## 2017-03-27 DIAGNOSIS — K219 Gastro-esophageal reflux disease without esophagitis: Secondary | ICD-10-CM

## 2017-03-27 MED ORDER — OMEPRAZOLE 40 MG PO CPDR: 40.0000 mg | DELAYED_RELEASE_CAPSULE | Freq: Every day | ORAL | 1 refills | Status: DC

## 2017-03-27 NOTE — Telephone Encounter (Signed)
Dr Patsy Lageropland-- I sent refill of omeprazole at pt's request. Please advise if any additional recommendations?

## 2017-06-17 ENCOUNTER — Other Ambulatory Visit: Payer: Self-pay | Admitting: Obstetrics & Gynecology

## 2017-06-17 DIAGNOSIS — Z1231 Encounter for screening mammogram for malignant neoplasm of breast: Secondary | ICD-10-CM

## 2017-06-28 ENCOUNTER — Ambulatory Visit
Admission: RE | Admit: 2017-06-28 | Discharge: 2017-06-28 | Disposition: A | Discharge status: Home / Self Care | Payer: Managed Care, Other (non HMO) | Source: Ambulatory Visit | Attending: Obstetrics & Gynecology | Admitting: Obstetrics & Gynecology

## 2017-06-28 DIAGNOSIS — Z1231 Encounter for screening mammogram for malignant neoplasm of breast: Secondary | ICD-10-CM

## 2017-08-23 ENCOUNTER — Other Ambulatory Visit: Payer: Self-pay | Admitting: Family Medicine

## 2017-08-23 DIAGNOSIS — K219 Gastro-esophageal reflux disease without esophagitis: Secondary | ICD-10-CM

## 2018-03-14 ENCOUNTER — Other Ambulatory Visit: Payer: Self-pay

## 2018-03-14 ENCOUNTER — Encounter: Payer: Self-pay | Admitting: Obstetrics and Gynecology

## 2018-03-14 ENCOUNTER — Ambulatory Visit: Payer: Managed Care, Other (non HMO) | Admitting: Obstetrics and Gynecology

## 2018-03-14 ENCOUNTER — Other Ambulatory Visit (HOSPITAL_COMMUNITY)
Admission: RE | Admit: 2018-03-14 | Discharge: 2018-03-14 | Disposition: A | Discharge status: Home / Self Care | Payer: Managed Care, Other (non HMO) | Source: Ambulatory Visit | Attending: Obstetrics and Gynecology | Admitting: Obstetrics and Gynecology

## 2018-03-14 VITALS — BP 110/60 | HR 64 | Resp 14 | Ht 66.0 in | Wt 170.0 lb

## 2018-03-14 DIAGNOSIS — Z01419 Encounter for gynecological examination (general) (routine) without abnormal findings: Secondary | ICD-10-CM | POA: Diagnosis not present

## 2018-03-14 DIAGNOSIS — R3915 Urgency of urination: Secondary | ICD-10-CM

## 2018-03-14 LAB — POCT URINALYSIS DIPSTICK
Bilirubin, UA: NEGATIVE
Glucose, UA: NEGATIVE
Ketones, UA: NEGATIVE
NITRITE UA: NEGATIVE
PH UA: 5 (ref 5.0–8.0)
PROTEIN UA: NEGATIVE
UROBILINOGEN UA: 0.2 U/dL

## 2018-03-14 MED ORDER — SULFAMETHOXAZOLE-TRIMETHOPRIM 800-160 MG PO TABS: 1.0000 | ORAL_TABLET | Freq: Two times a day (BID) | ORAL | 0 refills | Status: DC

## 2018-03-14 NOTE — Patient Instructions (Addendum)
EXERCISE AND DIET:  We recommended that you start or continue a regular exercise program for good health. Regular exercise means any activity that makes your heart beat faster and makes you sweat.  We recommend exercising at least 30 minutes per day at least 3 days a week, preferably 4 or 5.  We also recommend a diet low in fat and sugar.  Inactivity, poor dietary choices and obesity can cause diabetes, heart attack, stroke, and kidney damage, among others.    ALCOHOL AND SMOKING:  Women should limit their alcohol intake to no more than 7 drinks/beers/glasses of wine (combined, not each!) per week. Moderation of alcohol intake to this level decreases your risk of breast cancer and liver damage. And of course, no recreational drugs are part of a healthy lifestyle.  And absolutely no smoking or even second hand smoke. Most people know smoking can cause heart and lung diseases, but did you know it also contributes to weakening of your bones? Aging of your skin?  Yellowing of your teeth and nails?  CALCIUM AND VITAMIN D:  Adequate intake of calcium and Vitamin D are recommended.  The recommendations for exact amounts of these supplements seem to change often, but generally speaking 600 mg of calcium (either carbonate or citrate) and 800 units of Vitamin D per day seems prudent. Certain women may benefit from higher intake of Vitamin D.  If you are among these women, your doctor will have told you during your visit.    PAP SMEARS:  Pap smears, to check for cervical cancer or precancers,  have traditionally been done yearly, although recent scientific advances have shown that most women can have pap smears less often.  However, every woman still should have a physical exam from her gynecologist every year. It will include a breast check, inspection of the vulva and vagina to check for abnormal growths or skin changes, a visual exam of the cervix, and then an exam to evaluate the size and shape of the uterus and  ovaries.  And after 58 years of age, a rectal exam is indicated to check for rectal cancers. We will also provide age appropriate advice regarding health maintenance, like when you should have certain vaccines, screening for sexually transmitted diseases, bone density testing, colonoscopy, mammograms, etc.   MAMMOGRAMS:  All women over 40 years old should have a yearly mammogram. Many facilities now offer a "3D" mammogram, which may cost around $50 extra out of pocket. If possible,  we recommend you accept the option to have the 3D mammogram performed.  It both reduces the number of women who will be called back for extra views which then turn out to be normal, and it is better than the routine mammogram at detecting truly abnormal areas.    COLONOSCOPY:  Colonoscopy to screen for colon cancer is recommended for all women at age 50.  We know, you hate the idea of the prep.  We agree, BUT, having colon cancer and not knowing it is worse!!  Colon cancer so often starts as a polyp that can be seen and removed at colonscopy, which can quite literally save your life!  And if your first colonoscopy is normal and you have no family history of colon cancer, most women don't have to have it again for 10 years.  Once every ten years, you can do something that may end up saving your life, right?  We will be happy to help you get it scheduled when you are ready.    Be sure to check your insurance coverage so you understand how much it will cost.  It may be covered as a preventative service at no cost, but you should check your particular policy.      Urinary Tract Infection, Adult A urinary tract infection (UTI) is an infection of any part of the urinary tract. The urinary tract includes the:  Kidneys.  Ureters.  Bladder.  Urethra.  These organs make, store, and get rid of pee (urine) in the body. Follow these instructions at home:  Take over-the-counter and prescription medicines only as told by your  doctor.  If you were prescribed an antibiotic medicine, take it as told by your doctor. Do not stop taking the antibiotic even if you start to feel better.  Avoid the following drinks: ? Alcohol. ? Caffeine. ? Tea. ? Carbonated drinks.  Drink enough fluid to keep your pee clear or pale yellow.  Keep all follow-up visits as told by your doctor. This is important.  Make sure to: ? Empty your bladder often and completely. Do not to hold pee for long periods of time. ? Empty your bladder before and after sex. ? Wipe from front to back after a bowel movement if you are female. Use each tissue one time when you wipe. Contact a doctor if:  You have back pain.  You have a fever.  You feel sick to your stomach (nauseous).  You throw up (vomit).  Your symptoms do not get better after 3 days.  Your symptoms go away and then come back. Get help right away if:  You have very bad back pain.  You have very bad lower belly (abdominal) pain.  You are throwing up and cannot keep down any medicines or water. This information is not intended to replace advice given to you by your health care provider. Make sure you discuss any questions you have with your health care provider. Document Released: 03/05/2008 Document Revised: 02/23/2016 Document Reviewed: 08/08/2015 Elsevier Interactive Patient Education  2018 [employer]  

## 2018-03-14 NOTE — Progress Notes (Signed)
57 y.o. G2P1 Divorced Caucasian female here for annual exam. Patient complains of having urgency to urinate. Denies dysuria. Last UTI was last year tx through Urgent Care.  Urine: trace RBC, +1 WBC  PCP:  Dr. Warner MccreedyJessica Copeland   Patient's last menstrual period was 10/01/2008.           Sexually active: Yes.    The current method of family planning is post menopausal status.    Exercising: Yes.    classes at the gym Smoker:  Former  Health Maintenance: Pap:   03/09/16 Pap and HR HPV Negative             06-10-15 Neg:Neg HR HPV History of abnormal Pap:  Yes,04/09/13 LEEP: LGSIL, margins negative. Prior history to this: Pap 02/27/13 - AGUS and ASCUS-H and positive HR HPV. Colpo 03/23/13 - bx HGSIL, ECC negative, EMB negative endometrium and a strip of LGSIL. MMG: 06/28/17 BIRADS 1 negative/density b Colonoscopy:  03/2014 normal with Dr. Neill LoftMedoff;next due 03/2019 due to hx of polyps BMD:   04/01/12  Result  Normal TDaP:  2017 Gardasil:   n/a HIV: Negative Hep C: Negative Screening Labs: PCP   reports that she has quit smoking. Her smoking use included cigarettes. She has a 7.50 pack-year smoking history. She has never used smokeless tobacco. She reports that she drinks about 1.8 oz of alcohol per week. She reports that she does not use drugs.  Past Medical History:  Diagnosis Date  . Acid reflux   . Asthma   . Chronic insomnia 05/02/2015  . Colon polyp   . Elevated hemoglobin A1c May 2017   5.9  . Hiatal hernia   . Hypothyroid   . Memory difficulties 05/02/2015  . S/P LEEP (loop electrosurgical excision procedure) 7/14   HGSIL  . Tendonitis of elbow, right 2017  . Thyroid nodule    solid    Past Surgical History:  Procedure Laterality Date  . BREAST CYST EXCISION Right 2000  . BREAST MASS EXCISION     2 breast bx's--fibrocystic changes  . HEMORRHOID BANDING  03/2011   Dr. Kinnie ScalesMedoff  . THYROIDECTOMY, PARTIAL      Current Outpatient Medications  Medication Sig Dispense Refill  .  albuterol (PROAIR HFA) 108 (90 Base) MCG/ACT inhaler Inhale 1-2 puffs into the lungs every 6 (six) hours as needed for wheezing or shortness of breath. 1 Inhaler 6  . levothyroxine (SYNTHROID, LEVOTHROID) 100 MCG tablet TAKE 1 TABLET BY MOUTH EVERY MORNING BEFORE BREAKFAST 30 tablet 0  . Multiple Vitamin (MULTIVITAMIN) capsule Take 1 capsule by mouth daily.    Marland Kitchen. omeprazole (PRILOSEC) 40 MG capsule TAKE 1 CAPSULE BY MOUTH DAILY 90 capsule 0  . traZODone (DESYREL) 50 MG tablet Take 1 or 1.5 tablets nightly for insomnia 145 tablet 3   No current facility-administered medications for this visit.     Family History  Problem Relation Age of Onset  . Breast cancer Mother 5150  . Colon cancer Father [id number]  . Cancer Brother        throat    Review of Systems  Constitutional:       Hair loss  HENT: Negative.   Eyes: Negative.   Respiratory: Negative.   Cardiovascular: Negative.   Gastrointestinal:       Bloating   Endocrine: Negative.   Genitourinary: Positive for urgency.  Musculoskeletal: Negative.   Allergic/Immunologic: Negative.   Neurological: Negative.   Hematological: Negative.   Psychiatric/Behavioral: Negative.     Exam:  BP 110/60 (BP Location: Right Arm, Patient Position: Sitting, Cuff Size: Normal)   Pulse 64   Resp 14   Ht 5\' 6"  (1.676 m)   Wt 170 lb (77.1 kg)   LMP 10/01/2008   BMI 27.44 kg/m     General appearance: alert, cooperative and appears stated age Head: Normocephalic, without obvious abnormality, atraumatic Neck: no adenopathy, supple, symmetrical, trachea midline and thyroid normal to inspection and palpation Lungs: clear to auscultation bilaterally Breasts: normal appearance, no masses or tenderness, No nipple retraction or dimpling, No nipple discharge or bleeding, No axillary or supraclavicular adenopathy Heart: regular rate and rhythm Abdomen: soft, non-tender; no masses, no organomegaly Extremities: extremities normal, atraumatic, no cyanosis or  edema Skin: Skin color, texture, turgor normal. No rashes or lesions Lymph nodes: Cervical, supraclavicular, and axillary nodes normal. No abnormal inguinal nodes palpated Neurologic: Grossly normal  Pelvic: External genitalia:  no lesions              Urethra:  normal appearing urethra with no masses, tenderness or lesions              Bartholins and Skenes: normal                 Vagina: normal appearing vagina with normal color and discharge, no lesions              Cervix: no lesions.  Stenosis noted.               Pap taken: Yes.   Bimanual Exam:  Uterus:  normal size, contour, position, consistency, mobility, non-tender.  Tenderness with exam consistent with atrophy.               Adnexa: no mass, fullness, tenderness              Rectal exam: Yes.  .  Confirms.              Anus:  normal sphincter tone, no lesions  Chaperone was present for exam.  Assessment:   Well woman visit with normal exam. Hx LEEP with HGSIL. Hx prior AGUS pap.  Urinary pressure.  Suspect UTI. FH breast cancer in mother.  Plan: Mammogram screening. Recommended self breast awareness. Pap and HR HPV as above. Guidelines for Calcium, Vitamin D, regular exercise program including cardiovascular and weight bearing exercise. Urine micro and culture.  Bactrim DS po bid x 3 days.  Call if not improved.  We talked about vaginal atrophy and treatment with water based products, cook oils, and local vaginal estrogen.  Fasting labs with PCP.  Follow up annually and prn.    After visit summary provided.

## 2018-03-15 LAB — URINALYSIS, MICROSCOPIC ONLY
Casts: NONE SEEN /lpf
RBC, UA: NONE SEEN /hpf (ref 0–2)

## 2018-03-16 LAB — URINE CULTURE

## 2018-03-17 LAB — CYTOLOGY - PAP
DIAGNOSIS: NEGATIVE
HPV: NOT DETECTED

## 2018-06-18 ENCOUNTER — Other Ambulatory Visit: Payer: Self-pay | Admitting: Obstetrics & Gynecology

## 2018-06-18 DIAGNOSIS — Z1231 Encounter for screening mammogram for malignant neoplasm of breast: Secondary | ICD-10-CM

## 2018-07-15 ENCOUNTER — Ambulatory Visit: Payer: Managed Care, Other (non HMO)

## 2018-07-16 ENCOUNTER — Ambulatory Visit: Payer: Managed Care, Other (non HMO)

## 2018-07-17 ENCOUNTER — Ambulatory Visit
Admission: RE | Admit: 2018-07-17 | Discharge: 2018-07-17 | Disposition: A | Discharge status: Home / Self Care | Payer: Managed Care, Other (non HMO) | Source: Ambulatory Visit | Attending: Obstetrics & Gynecology | Admitting: Obstetrics & Gynecology

## 2018-07-17 DIAGNOSIS — Z1231 Encounter for screening mammogram for malignant neoplasm of breast: Secondary | ICD-10-CM

## 2018-07-21 ENCOUNTER — Ambulatory Visit: Payer: Managed Care, Other (non HMO)

## 2019-04-08 ENCOUNTER — Other Ambulatory Visit: Payer: Self-pay | Admitting: Obstetrics & Gynecology

## 2019-04-08 DIAGNOSIS — Z1231 Encounter for screening mammogram for malignant neoplasm of breast: Secondary | ICD-10-CM

## 2019-07-02 ENCOUNTER — Ambulatory Visit: Payer: Managed Care, Other (non HMO) | Admitting: Obstetrics and Gynecology

## 2019-07-20 ENCOUNTER — Ambulatory Visit: Payer: Managed Care, Other (non HMO)

## 2019-07-20 ENCOUNTER — Ambulatory Visit: Payer: Managed Care, Other (non HMO) | Admitting: Obstetrics and Gynecology

## 2019-07-23 ENCOUNTER — Encounter: Payer: Self-pay | Admitting: Obstetrics and Gynecology

## 2019-07-23 ENCOUNTER — Other Ambulatory Visit (HOSPITAL_COMMUNITY)
Admission: RE | Admit: 2019-07-23 | Discharge: 2019-07-23 | Disposition: A | Discharge status: Home / Self Care | Payer: BC Managed Care – PPO | Source: Ambulatory Visit | Attending: Obstetrics and Gynecology | Admitting: Obstetrics and Gynecology

## 2019-07-23 ENCOUNTER — Ambulatory Visit: Payer: BC Managed Care – PPO | Admitting: Obstetrics and Gynecology

## 2019-07-23 ENCOUNTER — Other Ambulatory Visit: Payer: Self-pay

## 2019-07-23 VITALS — BP 108/60 | HR 80 | Temp 97.2°F | Resp 12 | Ht 65.0 in | Wt 160.6 lb

## 2019-07-23 DIAGNOSIS — R3989 Other symptoms and signs involving the genitourinary system: Secondary | ICD-10-CM

## 2019-07-23 DIAGNOSIS — Z87410 Personal history of cervical dysplasia: Secondary | ICD-10-CM

## 2019-07-23 DIAGNOSIS — Z113 Encounter for screening for infections with a predominantly sexual mode of transmission: Secondary | ICD-10-CM | POA: Diagnosis not present

## 2019-07-23 DIAGNOSIS — Z01419 Encounter for gynecological examination (general) (routine) without abnormal findings: Secondary | ICD-10-CM | POA: Insufficient documentation

## 2019-07-23 LAB — POCT URINALYSIS DIPSTICK
Bilirubin, UA: NEGATIVE
Glucose, UA: NEGATIVE
Ketones, UA: NEGATIVE
Leukocytes, UA: NEGATIVE
Nitrite, UA: NEGATIVE
Protein, UA: NEGATIVE
Urobilinogen, UA: 0.2 E.U./dL
pH, UA: 6 (ref 5.0–8.0)

## 2019-07-23 MED ORDER — PREMARIN 0.625 MG/GM VA CREA: TOPICAL_CREAM | VAGINAL | 2 refills | Status: AC

## 2019-07-23 NOTE — Progress Notes (Signed)
58 y.o. G2P1 Divorced Caucasian female here for annual exam. Patient requests urine check due to recent UTI. Per patient has some pressure in bladder.  She saw a urologist and was dx with urinary retention as a cause of recurrent UTIs.  She is now taking Baclofen from a urologist in [place].  She had urodynamic testing done.   Denies vaginal bleeding.   Working from home.   Lab done with PCP in [place].   Urine: trace RBC  PCP: Warner Mccreedy, MD    Patient's last menstrual period was 10/01/2008.           Sexually active: No.  The current method of family planning is post menopausal status.    Exercising: No.  The patient does not participate in regular exercise at present. Smoker:  Former  Health Maintenance: Pap: 03-14-18 Neg:Neg HR HPV, 03-09-16 Neg:Neg HR HPV, 06-10-15 Neg:Neg HR HPV History of abnormal Pap:  Yes, ,04/09/13 LEEP: LGSIL, margins negative. Prior history to this: Pap 02/27/13 - AGUS and ASCUS-H and positive HR HPV. Colpo 03/23/13 - bx HGSIL, ECC negative, EMB negative endometrium and a strip of LGSIL. MMG:07-17-18 3D/Neg/density B/BiRads1--appt.07-24-19 Colonoscopy: 03/2014 normal -- patient will schedule BMD:   04-01-12  Result :Normal TDaP: 2017 Gardasil:   n/a HIV:Neg in past Hep C:Neg in past Screening Labs:  PCP   reports that she has quit smoking. Her smoking use included cigarettes. She has a 7.50 pack-year smoking history. She has never used smokeless tobacco. She reports current alcohol use of about 3.0 standard drinks of alcohol per week. She reports that she does not use drugs.  Past Medical History:  Diagnosis Date  . Acid reflux   . Asthma   . Chronic insomnia 05/02/2015  . Colon polyp   . Elevated hemoglobin A1c May 2017   5.9  . Hiatal hernia   . Hypothyroid   . Memory difficulties 05/02/2015  . S/P LEEP (loop electrosurgical excision procedure) 7/14   HGSIL  . Tendonitis of elbow, right 2017  . Thyroid nodule    solid    Past Surgical  History:  Procedure Laterality Date  . BREAST CYST EXCISION Right 2000  . BREAST MASS EXCISION     2 breast bx's--fibrocystic changes  . HEMORRHOID BANDING  03/2011   Dr. Kinnie Scales  . THYROIDECTOMY, PARTIAL      Current Outpatient Medications  Medication Sig Dispense Refill  . albuterol (PROAIR HFA) 108 (90 Base) MCG/ACT inhaler Inhale 1-2 puffs into the lungs every 6 (six) hours as needed for wheezing or shortness of breath. 1 Inhaler 6  . baclofen (LIORESAL) 10 MG tablet as needed.    Marland Kitchen levothyroxine (SYNTHROID, LEVOTHROID) 100 MCG tablet TAKE 1 TABLET BY MOUTH EVERY MORNING BEFORE BREAKFAST 30 tablet 0  . montelukast (SINGULAIR) 10 MG tablet TAKE 1 TABLET BY MOUTH EVERY DAY    . Multiple Vitamin (MULTIVITAMIN) capsule Take 1 capsule by mouth daily.    Marland Kitchen omeprazole (PRILOSEC) 40 MG capsule TAKE 1 CAPSULE BY MOUTH DAILY 90 capsule 0  . PRAVASTATIN SODIUM PO      No current facility-administered medications for this visit.     Family History  Problem Relation Age of Onset  . Breast cancer Mother 6  . Colon cancer Father 3  . Cancer Brother        throat    Review of Systems  Constitutional: Negative.   HENT: Negative.   Eyes: Negative.   Respiratory: Negative.   Cardiovascular: Negative.  Gastrointestinal: Negative.   Endocrine: Negative.   Genitourinary:       Pressure in bladder.  Musculoskeletal: Negative.   Skin: Negative.   Allergic/Immunologic: Negative.   Neurological: Negative.   Hematological: Negative.   Psychiatric/Behavioral: Negative.     Exam:   BP 108/60 (BP Location: Left Arm, Patient Position: Sitting, Cuff Size: Normal)   Pulse 80   Temp (!) 97.2 F (36.2 C) (Temporal)   Resp 12   Ht 5\' 5"  (1.651 m)   Wt 160 lb 9.6 oz (72.8 kg)   LMP 10/01/2008   BMI 26.73 kg/m     General appearance: alert, cooperative and appears stated age Head: normocephalic, without obvious abnormality, atraumatic Neck: no adenopathy, supple, symmetrical, trachea  midline and thyroid normal to inspection and palpation Lungs: clear to auscultation bilaterally Breasts: normal appearance, no masses or tenderness, No nipple retraction or dimpling, No nipple discharge or bleeding, No axillary adenopathy Heart: regular rate and rhythm Abdomen: soft, non-tender; no masses, no organomegaly Extremities: extremities normal, atraumatic, no cyanosis or edema Skin: skin color, texture, turgor normal. No rashes or lesions Lymph nodes: cervical, supraclavicular, and axillary nodes normal. Neurologic: grossly normal  Pelvic: External genitalia:  no lesions              No abnormal inguinal nodes palpated.              Urethra:  normal appearing urethra with no masses, tenderness or lesions              Bartholins and Skenes: normal                 Vagina: normal appearing vagina with normal color and discharge, no lesions              Cervix: no lesions              Pap taken: Yes.   Bimanual Exam:  Uterus:  normal size, contour, position, consistency, mobility, non-tender              Adnexa: no mass, fullness, tenderness              Rectal exam: Yes.  .  Confirms.              Anus:  normal sphincter tone, no lesions  Chaperone was present for exam.  Assessment:   Well woman visit with normal exam. Hx LEEP with HGSIL. Hx prior AGUS pap.  Urinary retention.  On Baclofen.  Microscopic hematuria.  Recent infection.  FH breast cancer in mother. Atrophy of vagina.   Plan: Mammogram screening discussed. Self breast awareness reviewed. Pap and HR HPV done today by request of the patient.  She is aware that she may pay for this out of pocket.  Guidelines for Calcium, Vitamin D, regular exercise program including cardiovascular and weight bearing exercise. Flu vaccine recommended.  Vaginal estrogen cream. STD screening.  Follow up annually and prn.   After visit summary provided.

## 2019-07-23 NOTE — Patient Instructions (Signed)

## 2019-07-24 ENCOUNTER — Ambulatory Visit
Admission: RE | Admit: 2019-07-24 | Discharge: 2019-07-24 | Disposition: A | Discharge status: Home / Self Care | Payer: BC Managed Care – PPO | Source: Ambulatory Visit | Attending: Obstetrics & Gynecology | Admitting: Obstetrics & Gynecology

## 2019-07-24 DIAGNOSIS — Z1231 Encounter for screening mammogram for malignant neoplasm of breast: Secondary | ICD-10-CM

## 2019-07-24 LAB — URINALYSIS, MICROSCOPIC ONLY: Casts: NONE SEEN /lpf

## 2019-07-24 LAB — HEP, RPR, HIV PANEL
HIV Screen 4th Generation wRfx: NONREACTIVE
Hepatitis B Surface Ag: NEGATIVE
RPR Ser Ql: NONREACTIVE

## 2019-07-24 LAB — HEPATITIS C ANTIBODY: Hep C Virus Ab: <0.1 s/co ratio (ref 0.0–0.9)

## 2019-07-25 LAB — URINE CULTURE

## 2019-07-28 LAB — CYTOLOGY - PAP
Chlamydia: NEGATIVE
Comment: NEGATIVE
Comment: NEGATIVE
Comment: NORMAL
Diagnosis: NEGATIVE
High risk HPV: NEGATIVE
Neisseria Gonorrhea: NEGATIVE
Trichomonas: NEGATIVE

## 2021-10-13 IMAGING — MG DIGITAL SCREENING BILAT W/ TOMO W/ CAD
8 series · 8 of 24 positions shown · non-contrast
Comparison: Previous exam(s).

CLINICAL DATA: Screening.

EXAM:
DIGITAL SCREENING BILATERAL MAMMOGRAM WITH TOMO AND CAD

[L CC synth-2D]
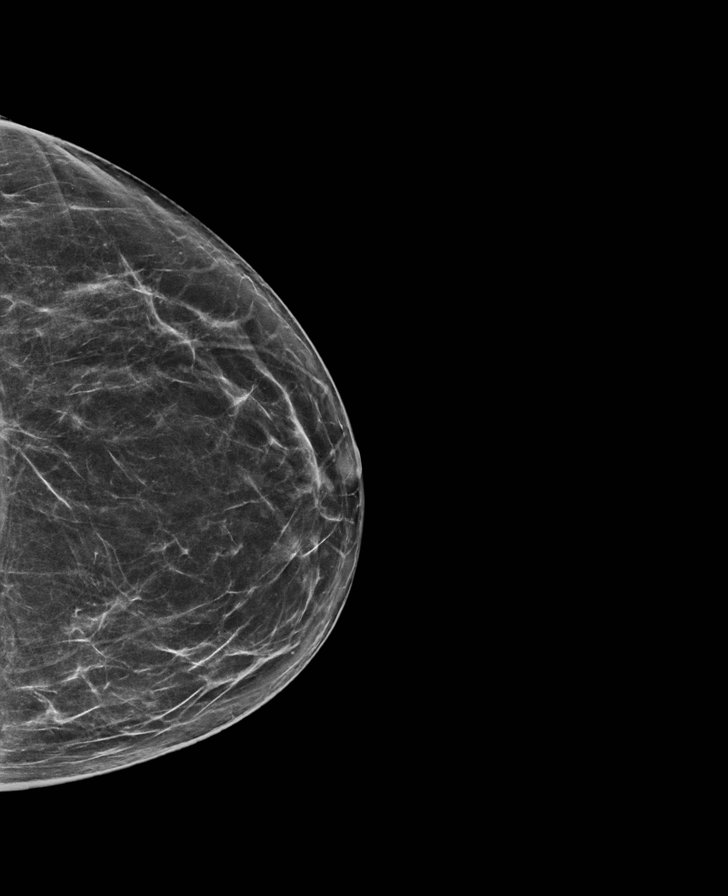

[R CC synth-2D]
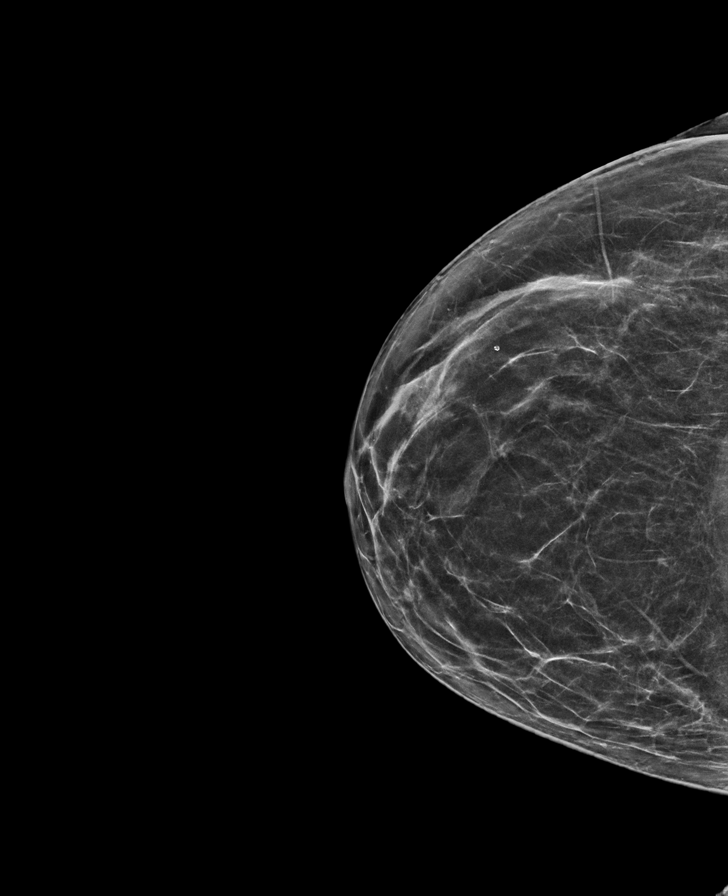

[R MLO synth-2D]
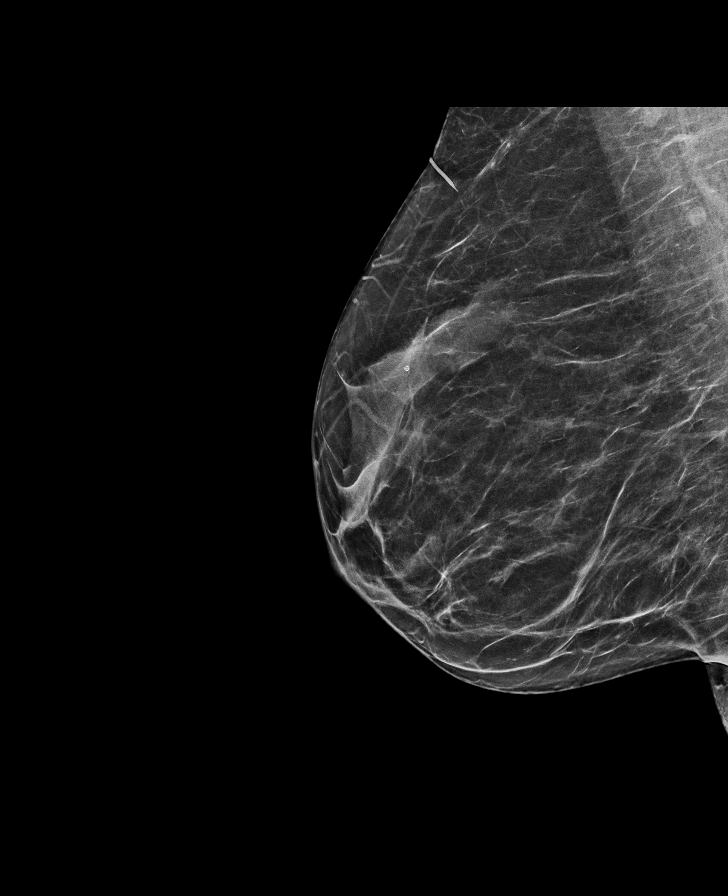

[L MLO synth-2D]
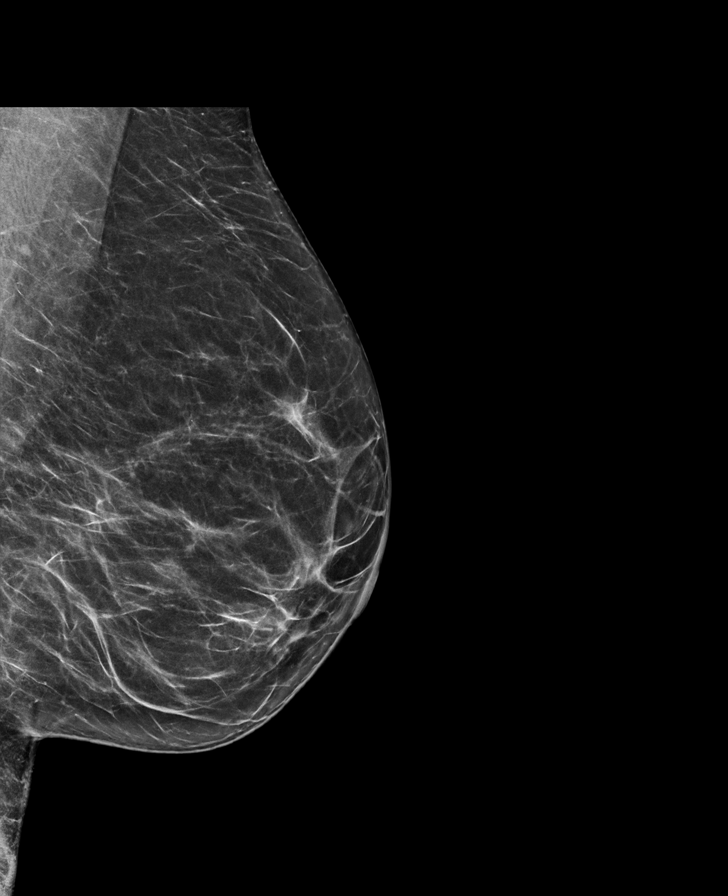

[R CC tomo · tomo slice 33/64.0]
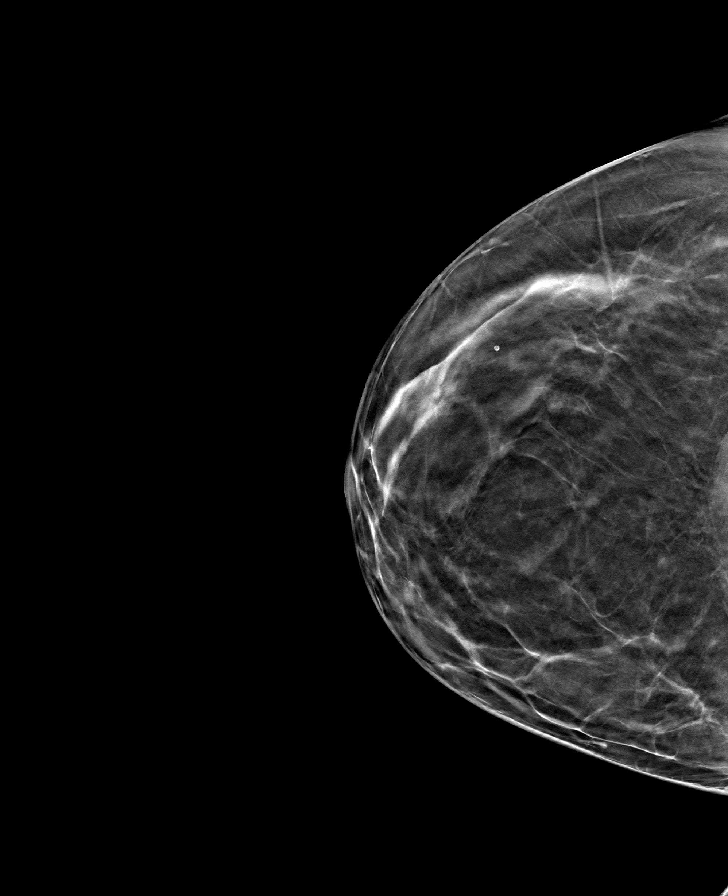

[L MLO tomo · tomo slice 35/70.0]
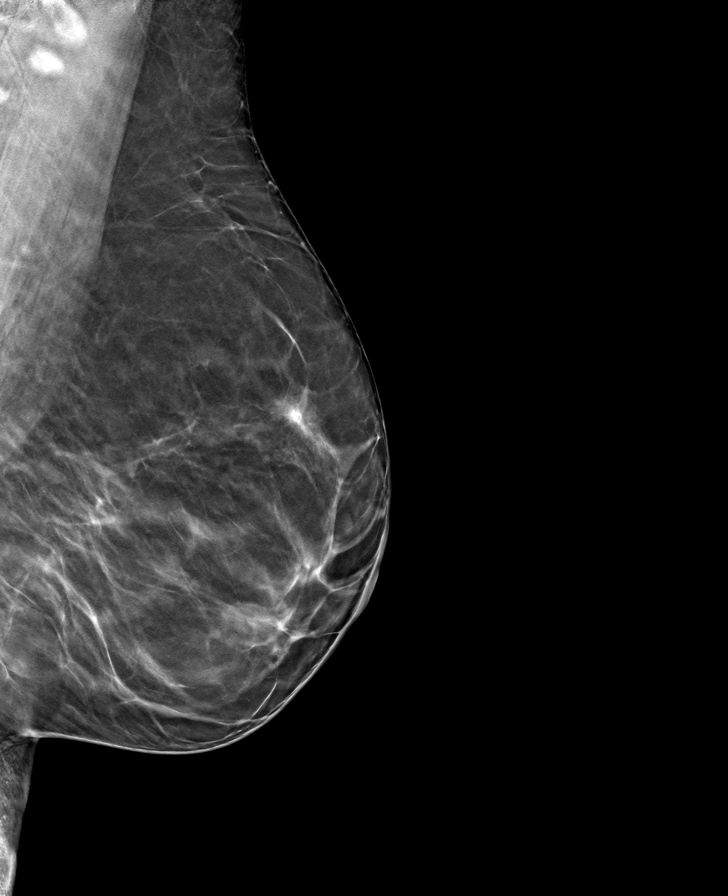

[L CC tomo · tomo slice 35/70.0]
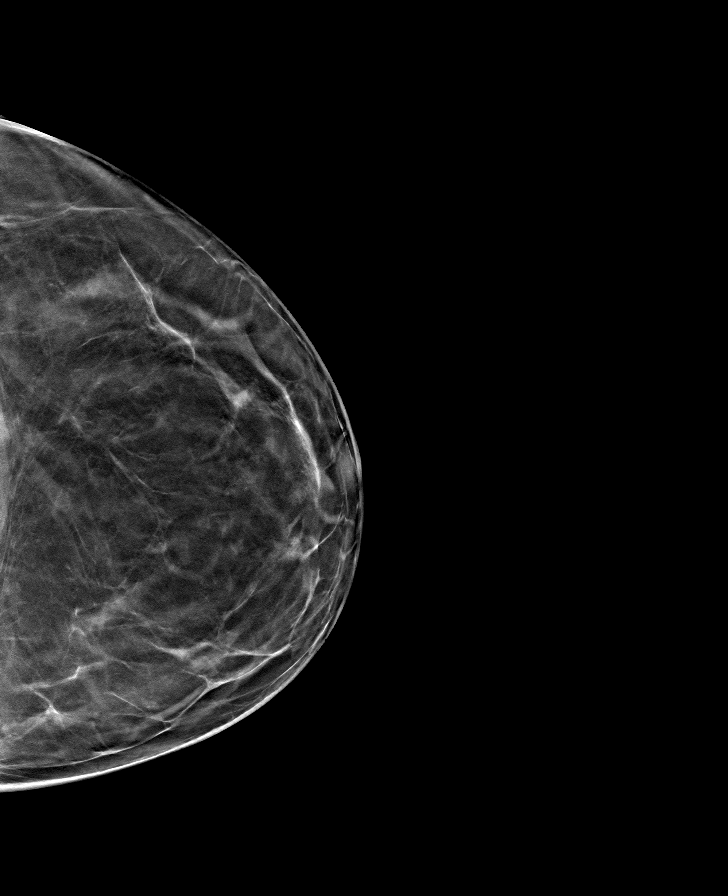

[R MLO tomo · tomo slice 34/67.0]
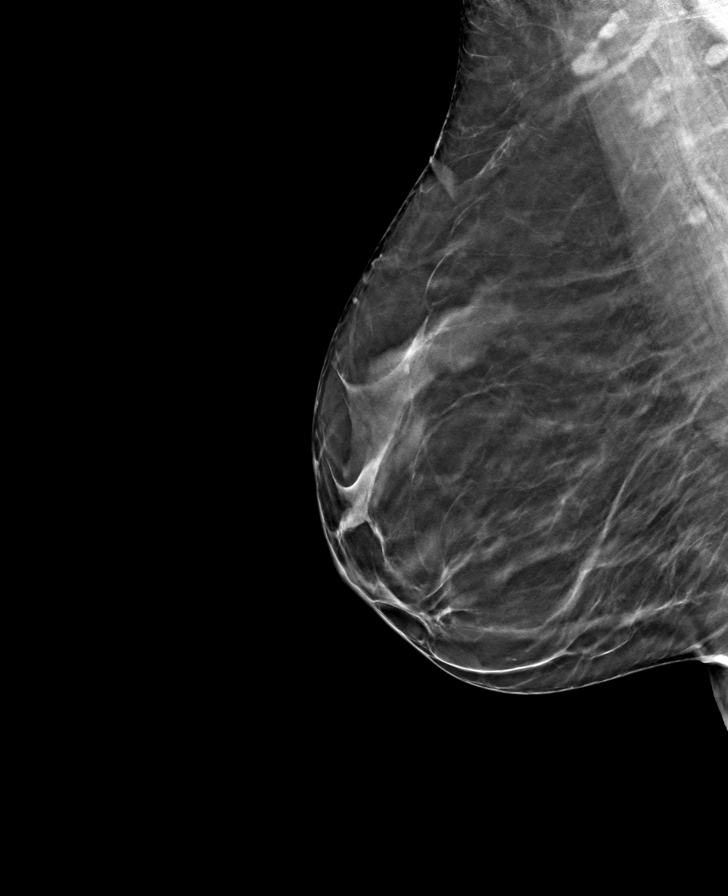

[8 of 24 positions shown; findings below may reference images not displayed]

ACR Breast Density Category b: There are scattered areas of
fibroglandular density.
FINDINGS: There are no findings suspicious for malignancy. Images were
processed with CAD.
IMPRESSION: No mammographic evidence of malignancy. A result letter of this
screening mammogram will be mailed directly to the patient.

RECOMMENDATION:
Screening mammogram in one year. (Code:[id number])

BI-RADS CATEGORY  1: Negative.
